# Patient Record
Sex: Female | Born: 1955
Health system: Southern US, Community
[De-identification: ages and names within clinical notes are randomized; demographics above are authoritative.]

## PROBLEM LIST (undated history)

## (undated) DIAGNOSIS — M5136 Other intervertebral disc degeneration, lumbar region: Secondary | ICD-10-CM

## (undated) DIAGNOSIS — T8859XA Other complications of anesthesia, initial encounter: Secondary | ICD-10-CM

## (undated) DIAGNOSIS — R7303 Prediabetes: Secondary | ICD-10-CM

## (undated) DIAGNOSIS — Z8719 Personal history of other diseases of the digestive system: Secondary | ICD-10-CM

## (undated) DIAGNOSIS — L408 Other psoriasis: Secondary | ICD-10-CM

## (undated) DIAGNOSIS — T4145XA Adverse effect of unspecified anesthetic, initial encounter: Secondary | ICD-10-CM

## (undated) DIAGNOSIS — T7840XA Allergy, unspecified, initial encounter: Secondary | ICD-10-CM

## (undated) DIAGNOSIS — K589 Irritable bowel syndrome without diarrhea: Secondary | ICD-10-CM

## (undated) DIAGNOSIS — F419 Anxiety disorder, unspecified: Secondary | ICD-10-CM

## (undated) DIAGNOSIS — M1990 Unspecified osteoarthritis, unspecified site: Secondary | ICD-10-CM

## (undated) DIAGNOSIS — K2270 Barrett's esophagus without dysplasia: Secondary | ICD-10-CM

## (undated) DIAGNOSIS — K219 Gastro-esophageal reflux disease without esophagitis: Secondary | ICD-10-CM

## (undated) DIAGNOSIS — F329 Major depressive disorder, single episode, unspecified: Secondary | ICD-10-CM

## (undated) DIAGNOSIS — G4720 Circadian rhythm sleep disorder, unspecified type: Secondary | ICD-10-CM

## (undated) DIAGNOSIS — M25512 Pain in left shoulder: Secondary | ICD-10-CM

## (undated) DIAGNOSIS — E119 Type 2 diabetes mellitus without complications: Secondary | ICD-10-CM

## (undated) DIAGNOSIS — C50919 Malignant neoplasm of unspecified site of unspecified female breast: Secondary | ICD-10-CM

## (undated) DIAGNOSIS — M51369 Other intervertebral disc degeneration, lumbar region without mention of lumbar back pain or lower extremity pain: Secondary | ICD-10-CM

## (undated) DIAGNOSIS — F32A Depression, unspecified: Secondary | ICD-10-CM

## (undated) DIAGNOSIS — E669 Obesity, unspecified: Secondary | ICD-10-CM

## (undated) DIAGNOSIS — M4800 Spinal stenosis, site unspecified: Secondary | ICD-10-CM

## (undated) DIAGNOSIS — J45909 Unspecified asthma, uncomplicated: Secondary | ICD-10-CM

## (undated) DIAGNOSIS — Z9889 Other specified postprocedural states: Secondary | ICD-10-CM

## (undated) HISTORY — DX: Other specified postprocedural states: Z98.890

## (undated) HISTORY — PX: DIAGNOSTIC LAPAROSCOPY: SUR761

## (undated) HISTORY — DX: Other psoriasis: L40.8

## (undated) HISTORY — PX: PARTIAL HYSTERECTOMY: SHX80

## (undated) HISTORY — PX: ABDOMINAL HYSTERECTOMY: SHX81

## (undated) HISTORY — DX: Prediabetes: R73.03

## (undated) HISTORY — PX: BREAST BIOPSY: SHX20

## (undated) HISTORY — PX: UPPER GASTROINTESTINAL ENDOSCOPY: SHX188

## (undated) HISTORY — DX: Depression, unspecified: F32.A

## (undated) HISTORY — DX: Obesity, unspecified: E66.9

## (undated) HISTORY — PX: BUNIONECTOMY: SHX129

## (undated) HISTORY — PX: OTHER SURGICAL HISTORY: SHX169

## (undated) HISTORY — PX: PILONIDAL CYST EXCISION: SHX744

## (undated) HISTORY — DX: Malignant neoplasm of unspecified site of unspecified female breast: C50.919

## (undated) HISTORY — DX: Personal history of other diseases of the digestive system: Z87.19

## (undated) HISTORY — DX: Allergy, unspecified, initial encounter: T78.40XA

## (undated) HISTORY — PX: COLONOSCOPY: SHX174

## (undated) HISTORY — DX: Major depressive disorder, single episode, unspecified: F32.9

## (undated) HISTORY — PX: WISDOM TOOTH EXTRACTION: SHX21

---

## 2003-07-30 HISTORY — PX: BREAST LUMPECTOMY: SHX2

## 2004-05-08 ENCOUNTER — Ambulatory Visit (HOSPITAL_BASED_OUTPATIENT_CLINIC_OR_DEPARTMENT_OTHER): Admission: RE | Admit: 2004-05-08 | Discharge: 2004-05-08 | Payer: Self-pay | Admitting: General Surgery

## 2004-05-08 ENCOUNTER — Encounter (INDEPENDENT_AMBULATORY_CARE_PROVIDER_SITE_OTHER): Payer: Self-pay | Admitting: *Deleted

## 2004-05-08 ENCOUNTER — Encounter: Admission: RE | Admit: 2004-05-08 | Discharge: 2004-05-08 | Payer: Self-pay | Admitting: General Surgery

## 2004-05-08 ENCOUNTER — Ambulatory Visit (HOSPITAL_COMMUNITY): Admission: RE | Admit: 2004-05-08 | Discharge: 2004-05-08 | Payer: Self-pay | Admitting: General Surgery

## 2004-05-30 ENCOUNTER — Ambulatory Visit: Admission: RE | Admit: 2004-05-30 | Discharge: 2004-07-26 | Payer: Self-pay | Admitting: Radiation Oncology

## 2004-06-11 ENCOUNTER — Ambulatory Visit: Payer: Self-pay | Admitting: Oncology

## 2004-08-22 ENCOUNTER — Ambulatory Visit: Admission: RE | Admit: 2004-08-22 | Discharge: 2004-09-07 | Payer: Self-pay | Admitting: Radiation Oncology

## 2004-09-17 ENCOUNTER — Ambulatory Visit: Payer: Self-pay | Admitting: Oncology

## 2004-10-04 ENCOUNTER — Encounter: Admission: RE | Admit: 2004-10-04 | Discharge: 2004-10-04 | Payer: Self-pay | Admitting: Oncology

## 2005-01-16 ENCOUNTER — Encounter: Admission: RE | Admit: 2005-01-16 | Discharge: 2005-01-16 | Payer: Self-pay | Admitting: Oncology

## 2005-01-28 ENCOUNTER — Encounter: Admission: RE | Admit: 2005-01-28 | Discharge: 2005-01-28 | Payer: Self-pay | Admitting: Oncology

## 2005-01-30 ENCOUNTER — Ambulatory Visit: Payer: Self-pay | Admitting: Oncology

## 2005-07-26 ENCOUNTER — Ambulatory Visit: Payer: Self-pay | Admitting: Oncology

## 2006-01-20 ENCOUNTER — Ambulatory Visit: Payer: Self-pay | Admitting: Oncology

## 2006-01-20 ENCOUNTER — Encounter: Admission: RE | Admit: 2006-01-20 | Discharge: 2006-01-20 | Payer: Self-pay | Admitting: Family Medicine

## 2006-01-22 LAB — COMPREHENSIVE METABOLIC PANEL
Albumin: 4.2 g/dL (ref 3.5–5.2)
Alkaline Phosphatase: 123 U/L — ABNORMAL HIGH (ref 39–117)
BUN: 13 mg/dL (ref 6–23)
Calcium: 9.3 mg/dL (ref 8.4–10.5)
Glucose, Bld: 110 mg/dL — ABNORMAL HIGH (ref 70–99)
Potassium: 4.2 mEq/L (ref 3.5–5.3)

## 2006-01-22 LAB — CBC WITH DIFFERENTIAL/PLATELET
Basophils Absolute: 0 10*3/uL (ref 0.0–0.1)
Eosinophils Absolute: 0.2 10*3/uL (ref 0.0–0.5)
HGB: 15.4 g/dL (ref 11.6–15.9)
NEUT#: 3.8 10*3/uL (ref 1.5–6.5)
RDW: 12.7 % (ref 11.3–14.5)
lymph#: 2 10*3/uL (ref 0.9–3.3)

## 2006-07-14 ENCOUNTER — Ambulatory Visit: Payer: Self-pay | Admitting: Oncology

## 2006-07-16 LAB — CBC WITH DIFFERENTIAL/PLATELET
Basophils Absolute: 0 10*3/uL (ref 0.0–0.1)
EOS%: 2.3 % (ref 0.0–7.0)
HCT: 46.2 % (ref 34.8–46.6)
HGB: 15.6 g/dL (ref 11.6–15.9)
MCH: 33.2 pg (ref 26.0–34.0)
MONO#: 0.4 10*3/uL (ref 0.1–0.9)
NEUT%: 62.8 % (ref 39.6–76.8)
lymph#: 2.1 10*3/uL (ref 0.9–3.3)

## 2006-07-16 LAB — LACTATE DEHYDROGENASE: LDH: 192 U/L (ref 94–250)

## 2006-07-16 LAB — COMPREHENSIVE METABOLIC PANEL
BUN: 18 mg/dL (ref 6–23)
CO2: 28 mEq/L (ref 19–32)
Calcium: 9.4 mg/dL (ref 8.4–10.5)
Chloride: 101 mEq/L (ref 96–112)
Creatinine, Ser: 0.85 mg/dL (ref 0.40–1.20)

## 2007-01-13 ENCOUNTER — Ambulatory Visit: Payer: Self-pay | Admitting: Oncology

## 2007-01-15 LAB — CBC WITH DIFFERENTIAL/PLATELET
Eosinophils Absolute: 0.2 10*3/uL (ref 0.0–0.5)
MONO#: 0.3 10*3/uL (ref 0.1–0.9)
NEUT#: 3.9 10*3/uL (ref 1.5–6.5)
Platelets: 286 10*3/uL (ref 145–400)
RBC: 4.68 10*6/uL (ref 3.70–5.32)
RDW: 13.2 % (ref 11.3–14.5)
WBC: 6.9 10*3/uL (ref 3.9–10.0)

## 2007-01-15 LAB — COMPREHENSIVE METABOLIC PANEL
Albumin: 4.3 g/dL (ref 3.5–5.2)
CO2: 27 mEq/L (ref 19–32)
Chloride: 102 mEq/L (ref 96–112)
Glucose, Bld: 103 mg/dL — ABNORMAL HIGH (ref 70–99)
Potassium: 4.5 mEq/L (ref 3.5–5.3)
Sodium: 143 mEq/L (ref 135–145)
Total Protein: 6.9 g/dL (ref 6.0–8.3)

## 2007-01-15 LAB — LACTATE DEHYDROGENASE: LDH: 184 U/L (ref 94–250)

## 2007-01-27 ENCOUNTER — Encounter: Admission: RE | Admit: 2007-01-27 | Discharge: 2007-01-27 | Payer: Self-pay | Admitting: Oncology

## 2007-07-17 ENCOUNTER — Ambulatory Visit: Payer: Self-pay | Admitting: Oncology

## 2007-07-21 LAB — CBC WITH DIFFERENTIAL/PLATELET
BASO%: 0.6 % (ref 0.0–2.0)
Eosinophils Absolute: 0.1 10*3/uL (ref 0.0–0.5)
MCHC: 34.6 g/dL (ref 32.0–36.0)
MONO#: 0.4 10*3/uL (ref 0.1–0.9)
NEUT#: 4.8 10*3/uL (ref 1.5–6.5)
RBC: 4.58 10*6/uL (ref 3.70–5.32)
RDW: 13.5 % (ref 11.3–14.5)
WBC: 7.5 10*3/uL (ref 3.9–10.0)
lymph#: 2.2 10*3/uL (ref 0.9–3.3)

## 2007-07-21 LAB — COMPREHENSIVE METABOLIC PANEL
ALT: 28 U/L (ref 0–35)
Albumin: 4.2 g/dL (ref 3.5–5.2)
CO2: 26 mEq/L (ref 19–32)
Chloride: 101 mEq/L (ref 96–112)
Potassium: 4.2 mEq/L (ref 3.5–5.3)
Sodium: 140 mEq/L (ref 135–145)
Total Bilirubin: 0.7 mg/dL (ref 0.3–1.2)
Total Protein: 7.2 g/dL (ref 6.0–8.3)

## 2008-01-06 ENCOUNTER — Ambulatory Visit: Payer: Self-pay | Admitting: Oncology

## 2008-01-11 LAB — CBC WITH DIFFERENTIAL/PLATELET
Basophils Absolute: 0 10*3/uL (ref 0.0–0.1)
EOS%: 1.6 % (ref 0.0–7.0)
HCT: 44.1 % (ref 34.8–46.6)
HGB: 15.4 g/dL (ref 11.6–15.9)
LYMPH%: 26.5 % (ref 14.0–48.0)
MCH: 33.8 pg (ref 26.0–34.0)
MCV: 96.4 fL (ref 81.0–101.0)
MONO%: 5.3 % (ref 0.0–13.0)
NEUT%: 66.2 % (ref 39.6–76.8)
Platelets: 292 10*3/uL (ref 145–400)
lymph#: 2.3 10*3/uL (ref 0.9–3.3)

## 2008-01-11 LAB — COMPREHENSIVE METABOLIC PANEL
AST: 19 U/L (ref 0–37)
BUN: 19 mg/dL (ref 6–23)
Calcium: 9.2 mg/dL (ref 8.4–10.5)
Chloride: 105 mEq/L (ref 96–112)
Creatinine, Ser: 0.88 mg/dL (ref 0.40–1.20)
Total Bilirubin: 0.5 mg/dL (ref 0.3–1.2)

## 2008-01-28 ENCOUNTER — Encounter: Admission: RE | Admit: 2008-01-28 | Discharge: 2008-01-28 | Payer: Self-pay | Admitting: Oncology

## 2008-07-18 ENCOUNTER — Ambulatory Visit: Payer: Self-pay | Admitting: Oncology

## 2008-07-20 LAB — CBC WITH DIFFERENTIAL/PLATELET
Basophils Absolute: 0.1 10*3/uL (ref 0.0–0.1)
EOS%: 3 % (ref 0.0–7.0)
Eosinophils Absolute: 0.2 10*3/uL (ref 0.0–0.5)
HCT: 44.6 % (ref 34.8–46.6)
HGB: 15.2 g/dL (ref 11.6–15.9)
LYMPH%: 26.3 % (ref 14.0–48.0)
MCH: 33.7 pg (ref 26.0–34.0)
MCV: 98.8 fL (ref 81.0–101.0)
MONO%: 6.3 % (ref 0.0–13.0)
NEUT%: 63.2 % (ref 39.6–76.8)
Platelets: 285 10*3/uL (ref 145–400)
RDW: 13.6 % (ref 11.3–14.5)

## 2008-07-21 LAB — COMPREHENSIVE METABOLIC PANEL
AST: 17 U/L (ref 0–37)
Alkaline Phosphatase: 118 U/L — ABNORMAL HIGH (ref 39–117)
BUN: 15 mg/dL (ref 6–23)
Creatinine, Ser: 0.87 mg/dL (ref 0.40–1.20)
Glucose, Bld: 148 mg/dL — ABNORMAL HIGH (ref 70–99)
Total Bilirubin: 0.4 mg/dL (ref 0.3–1.2)

## 2008-07-21 LAB — VITAMIN D 25 HYDROXY (VIT D DEFICIENCY, FRACTURES): Vit D, 25-Hydroxy: 54 ng/mL (ref 30–89)

## 2009-01-02 ENCOUNTER — Ambulatory Visit: Payer: Self-pay | Admitting: Oncology

## 2009-01-04 LAB — COMPREHENSIVE METABOLIC PANEL
ALT: 29 U/L (ref 0–35)
AST: 25 U/L (ref 0–37)
Albumin: 3.7 g/dL (ref 3.5–5.2)
Alkaline Phosphatase: 121 U/L — ABNORMAL HIGH (ref 39–117)
BUN: 11 mg/dL (ref 6–23)
CO2: 26 mEq/L (ref 19–32)
Calcium: 9.3 mg/dL (ref 8.4–10.5)
Chloride: 105 mEq/L (ref 96–112)
Creatinine, Ser: 0.81 mg/dL (ref 0.40–1.20)
Glucose, Bld: 112 mg/dL — ABNORMAL HIGH (ref 70–99)
Potassium: 4.6 mEq/L (ref 3.5–5.3)
Sodium: 138 mEq/L (ref 135–145)
Total Bilirubin: 0.8 mg/dL (ref 0.3–1.2)
Total Protein: 7.1 g/dL (ref 6.0–8.3)

## 2009-01-04 LAB — CBC WITH DIFFERENTIAL/PLATELET
BASO%: 0.3 % (ref 0.0–2.0)
Basophils Absolute: 0 10*3/uL (ref 0.0–0.1)
EOS%: 2.7 % (ref 0.0–7.0)
Eosinophils Absolute: 0.2 10*3/uL (ref 0.0–0.5)
HCT: 44.7 % (ref 34.8–46.6)
HGB: 15.7 g/dL (ref 11.6–15.9)
LYMPH%: 30.4 % (ref 14.0–49.7)
MCH: 34.5 pg — ABNORMAL HIGH (ref 25.1–34.0)
MCHC: 35.2 g/dL (ref 31.5–36.0)
MCV: 98.1 fL (ref 79.5–101.0)
MONO#: 0.4 10*3/uL (ref 0.1–0.9)
MONO%: 5.5 % (ref 0.0–14.0)
NEUT#: 4.5 10*3/uL (ref 1.5–6.5)
NEUT%: 61.1 % (ref 38.4–76.8)
Platelets: 258 10*3/uL (ref 145–400)
RBC: 4.55 10*6/uL (ref 3.70–5.45)
RDW: 13.9 % (ref 11.2–14.5)
WBC: 7.3 10*3/uL (ref 3.9–10.3)
lymph#: 2.2 10*3/uL (ref 0.9–3.3)

## 2009-01-04 LAB — LACTATE DEHYDROGENASE: LDH: 159 U/L (ref 94–250)

## 2009-02-02 ENCOUNTER — Encounter: Admission: RE | Admit: 2009-02-02 | Discharge: 2009-02-02 | Payer: Self-pay | Admitting: Oncology

## 2009-07-11 ENCOUNTER — Ambulatory Visit: Payer: Self-pay | Admitting: Oncology

## 2009-07-13 LAB — COMPREHENSIVE METABOLIC PANEL
ALT: 34 U/L (ref 0–35)
AST: 27 U/L (ref 0–37)
Albumin: 3.9 g/dL (ref 3.5–5.2)
Alkaline Phosphatase: 117 U/L (ref 39–117)
BUN: 11 mg/dL (ref 6–23)
CO2: 26 mEq/L (ref 19–32)
Calcium: 9.5 mg/dL (ref 8.4–10.5)
Chloride: 104 mEq/L (ref 96–112)
Creatinine, Ser: 0.87 mg/dL (ref 0.40–1.20)
Glucose, Bld: 63 mg/dL — ABNORMAL LOW (ref 70–99)
Potassium: 4.2 mEq/L (ref 3.5–5.3)
Sodium: 137 mEq/L (ref 135–145)
Total Bilirubin: 0.8 mg/dL (ref 0.3–1.2)
Total Protein: 7.6 g/dL (ref 6.0–8.3)

## 2009-07-13 LAB — CBC WITH DIFFERENTIAL/PLATELET
BASO%: 0.7 % (ref 0.0–2.0)
Basophils Absolute: 0.1 10*3/uL (ref 0.0–0.1)
EOS%: 2.4 % (ref 0.0–7.0)
Eosinophils Absolute: 0.2 10*3/uL (ref 0.0–0.5)
HCT: 48 % — ABNORMAL HIGH (ref 34.8–46.6)
HGB: 16.3 g/dL — ABNORMAL HIGH (ref 11.6–15.9)
LYMPH%: 36.6 % (ref 14.0–49.7)
MCH: 34.2 pg — ABNORMAL HIGH (ref 25.1–34.0)
MCHC: 33.9 g/dL (ref 31.5–36.0)
MCV: 100.8 fL (ref 79.5–101.0)
MONO#: 0.5 10*3/uL (ref 0.1–0.9)
MONO%: 7.1 % (ref 0.0–14.0)
NEUT#: 3.8 10*3/uL (ref 1.5–6.5)
NEUT%: 53.2 % (ref 38.4–76.8)
Platelets: 259 10*3/uL (ref 145–400)
RBC: 4.76 10*6/uL (ref 3.70–5.45)
RDW: 13.7 % (ref 11.2–14.5)
WBC: 7.2 10*3/uL (ref 3.9–10.3)
lymph#: 2.6 10*3/uL (ref 0.9–3.3)

## 2009-07-13 LAB — LACTATE DEHYDROGENASE: LDH: 170 U/L (ref 94–250)

## 2009-07-14 LAB — VITAMIN D 25 HYDROXY (VIT D DEFICIENCY, FRACTURES): Vit D, 25-Hydroxy: 34 ng/mL (ref 30–89)

## 2009-07-23 ENCOUNTER — Emergency Department (HOSPITAL_BASED_OUTPATIENT_CLINIC_OR_DEPARTMENT_OTHER): Admission: EM | Admit: 2009-07-23 | Discharge: 2009-07-23 | Payer: Self-pay | Admitting: Emergency Medicine

## 2010-02-13 ENCOUNTER — Encounter: Admission: RE | Admit: 2010-02-13 | Discharge: 2010-02-13 | Payer: Self-pay | Admitting: Oncology

## 2010-07-10 ENCOUNTER — Ambulatory Visit: Payer: Self-pay | Admitting: Oncology

## 2010-07-12 LAB — CBC WITH DIFFERENTIAL/PLATELET
BASO%: 0.5 % (ref 0.0–2.0)
Basophils Absolute: 0 10*3/uL (ref 0.0–0.1)
EOS%: 2.8 % (ref 0.0–7.0)
Eosinophils Absolute: 0.2 10*3/uL (ref 0.0–0.5)
HCT: 44.7 % (ref 34.8–46.6)
HGB: 15.4 g/dL (ref 11.6–15.9)
LYMPH%: 28.2 % (ref 14.0–49.7)
MCH: 34.8 pg — ABNORMAL HIGH (ref 25.1–34.0)
MCHC: 34.6 g/dL (ref 31.5–36.0)
MCV: 100.5 fL (ref 79.5–101.0)
MONO#: 0.4 10*3/uL (ref 0.1–0.9)
MONO%: 5.5 % (ref 0.0–14.0)
NEUT#: 5 10*3/uL (ref 1.5–6.5)
NEUT%: 63 % (ref 38.4–76.8)
Platelets: 289 10*3/uL (ref 145–400)
RBC: 4.44 10*6/uL (ref 3.70–5.45)
RDW: 13.4 % (ref 11.2–14.5)
WBC: 7.9 10*3/uL (ref 3.9–10.3)
lymph#: 2.2 10*3/uL (ref 0.9–3.3)

## 2010-07-13 LAB — COMPREHENSIVE METABOLIC PANEL
ALT: 24 U/L (ref 0–35)
AST: 23 U/L (ref 0–37)
Albumin: 4 g/dL (ref 3.5–5.2)
Alkaline Phosphatase: 109 U/L (ref 39–117)
BUN: 13 mg/dL (ref 6–23)
CO2: 23 mEq/L (ref 19–32)
Calcium: 9 mg/dL (ref 8.4–10.5)
Chloride: 105 mEq/L (ref 96–112)
Creatinine, Ser: 0.98 mg/dL (ref 0.40–1.20)
Glucose, Bld: 94 mg/dL (ref 70–99)
Potassium: 4.5 mEq/L (ref 3.5–5.3)
Sodium: 139 mEq/L (ref 135–145)
Total Bilirubin: 0.3 mg/dL (ref 0.3–1.2)
Total Protein: 6.4 g/dL (ref 6.0–8.3)

## 2010-07-13 LAB — LACTATE DEHYDROGENASE: LDH: 168 U/L (ref 94–250)

## 2010-07-13 LAB — VITAMIN D 25 HYDROXY (VIT D DEFICIENCY, FRACTURES): Vit D, 25-Hydroxy: 54 ng/mL (ref 30–89)

## 2010-08-19 ENCOUNTER — Encounter: Payer: Self-pay | Admitting: Oncology

## 2010-08-20 ENCOUNTER — Encounter: Payer: Self-pay | Admitting: Oncology

## 2010-09-13 ENCOUNTER — Other Ambulatory Visit: Payer: Self-pay | Admitting: Oncology

## 2010-09-13 DIAGNOSIS — Z9889 Other specified postprocedural states: Secondary | ICD-10-CM

## 2010-12-14 NOTE — Op Note (Signed)
Lindsey Keller, Lindsey Keller           ACCOUNT NO.:  [id number]   MEDICAL RECORD NO.:  [id number]          PATIENT TYPE:  AMB   LOCATION:  DSC                          FACILITY:  [facility]   PHYSICIAN:  Rose Phi. Maple Hudson, M.D.   DATE OF BIRTH:  April 21, 1956   DATE OF PROCEDURE:  05/08/2004  DATE OF DISCHARGE:                                 OPERATIVE REPORT   PREOPERATIVE DIAGNOSIS:  Ductal carcinoma in situ of the left breast.   POSTOPERATIVE DIAGNOSIS:  Ductal carcinoma in situ of the left breast.   OPERATION:  Left partial mastectomy with needle localization and specimen  mammogram.   SURGEON:  Rose Phi. Maple Hudson, M.D.   ANESTHESIA:  General.   OPERATIVE PROCEDURE:  This patient had a routine mammogram done that showed  an area of microcalcifications in the 12 o'clock position of her left  breast.  Biopsy had shown this to be DCIS that was ER/PR positive.  She was  then scheduled for partial mastectomy.   Prior to coming to the operating room, a localizing wire had been placed.   After general anesthesia was induced, she was placed in the supine position  with the arms extended on the arm board and the left breast prepped and  draped in the usual fashion.  A curved incision centered at the 12 o'clock  position using guidance from the previously-placed wire was outlined and the  incision then made and the wire exposed and then a wide excision of the wire  and surrounding tissue carried out.  Hemostasis obtained with the cautery.  Specimen oriented for the pathologist.  Specimen mammogram confirmed the  removal of the lesion.   I then infiltrated the tissue with 0.25% Marcaine.  The incision was closed  in two layers of 3-0 Vicryl and subcuticular 4-0 Monocryl and Steri-Strips.  Dressing applied.  The patient was brought to the recovery room in  satisfactory condition, having tolerated the procedure well. __________.      Pete   PRY/MEDQ  D:  05/08/2004  T:  05/08/2004  Job:  [id number]

## 2011-02-19 ENCOUNTER — Ambulatory Visit
Admission: RE | Admit: 2011-02-19 | Discharge: 2011-02-19 | Disposition: A | Discharge status: Home / Self Care | Payer: 59 | Source: Ambulatory Visit | Attending: Oncology | Admitting: Oncology

## 2011-02-19 DIAGNOSIS — Z9889 Other specified postprocedural states: Secondary | ICD-10-CM

## 2011-07-11 ENCOUNTER — Other Ambulatory Visit (HOSPITAL_BASED_OUTPATIENT_CLINIC_OR_DEPARTMENT_OTHER): Payer: 59 | Admitting: Lab

## 2011-07-11 ENCOUNTER — Other Ambulatory Visit: Payer: Self-pay | Admitting: Oncology

## 2011-07-11 DIAGNOSIS — D0590 Unspecified type of carcinoma in situ of unspecified breast: Secondary | ICD-10-CM

## 2011-07-11 LAB — COMPREHENSIVE METABOLIC PANEL
ALT: 21 U/L (ref 0–35)
Alkaline Phosphatase: 101 U/L (ref 39–117)
CO2: 26 mEq/L (ref 19–32)
Sodium: 139 mEq/L (ref 135–145)
Total Bilirubin: 0.5 mg/dL (ref 0.3–1.2)
Total Protein: 7.1 g/dL (ref 6.0–8.3)

## 2011-07-11 LAB — CBC WITH DIFFERENTIAL/PLATELET
BASO%: 0.7 % (ref 0.0–2.0)
LYMPH%: 28.4 % (ref 14.0–49.7)
MCHC: 34 g/dL (ref 31.5–36.0)
MONO#: 0.3 10*3/uL (ref 0.1–0.9)
MONO%: 4.6 % (ref 0.0–14.0)
Platelets: 281 10*3/uL (ref 145–400)
RBC: 4.77 10*6/uL (ref 3.70–5.45)
RDW: 13.6 % (ref 11.2–14.5)
WBC: 7.4 10*3/uL (ref 3.9–10.3)

## 2011-07-19 ENCOUNTER — Telehealth: Payer: Self-pay | Admitting: Oncology

## 2011-07-19 ENCOUNTER — Ambulatory Visit (HOSPITAL_BASED_OUTPATIENT_CLINIC_OR_DEPARTMENT_OTHER): Payer: 59 | Admitting: Oncology

## 2011-07-19 ENCOUNTER — Other Ambulatory Visit: Payer: Self-pay | Admitting: Oncology

## 2011-07-19 ENCOUNTER — Encounter: Payer: Self-pay | Admitting: Oncology

## 2011-07-19 VITALS — BP 111/75 | HR 101 | Temp 98.1°F | Ht 62.5 in | Wt 243.2 lb

## 2011-07-19 DIAGNOSIS — E559 Vitamin D deficiency, unspecified: Secondary | ICD-10-CM

## 2011-07-19 DIAGNOSIS — C50919 Malignant neoplasm of unspecified site of unspecified female breast: Secondary | ICD-10-CM

## 2011-07-19 DIAGNOSIS — E119 Type 2 diabetes mellitus without complications: Secondary | ICD-10-CM

## 2011-07-19 DIAGNOSIS — Z853 Personal history of malignant neoplasm of breast: Secondary | ICD-10-CM

## 2011-07-19 NOTE — Telephone Encounter (Signed)
called pt and informed her of appts in dec2013 with mammogram and bone density for 02/25/2012 @ 9am @ [facility] in [place]

## 2011-07-19 NOTE — Progress Notes (Signed)
07/19/11 -  11:30 am - Spoke with patient when she came into cancer center to see Dr. Donnie Coffin. She is on the NSABP B-35 study.  This is a follow-up visit.  She states that she had not been in the hospital in the last year. She has not had any fractures or broken bones.  The is not on any medications except calcium and vitamin D.  She has not had a bone density test in the last year.  She is not taking any vaginal hormonal therapy. She is being followed yearly for the study.

## 2011-07-19 NOTE — Progress Notes (Signed)
Hematology and Oncology Follow Up Visit  Lindsey Keller [id number] 1956/05/25 55 y.o. 07/19/2011 12:29 PM PCP [facility] [place]  Principle Diagnosis: DCIS s/p excision 10/05; s/p xrt 07/24/04, er/pr+ on B35 study, completed 2010, On annual f/u.  Interim History:  There have been no intercurrent illness, hospitalizations or medication changes.  Medications: I have reviewed the patient's current medications.  Allergies: No Known Allergies  Past Medical History, Surgical history, Social history, and Family History were reviewed and updated.  Review of Systems: Constitutional:  Negative for fever, chills, night sweats, anorexia, weight loss, pain. Cardiovascular: no chest pain or dyspnea on exertion Respiratory: no cough, shortness of breath, or wheezing Neurological: no TIA or stroke symptoms Dermatological: negative ENT: negative Skin Gastrointestinal: no abdominal pain, change in bowel habits, or black or bloody stools Genito-Urinary: no dysuria, trouble voiding, or hematuria Hematological and Lymphatic: negative Breast: negative for breast lumps Musculoskeletal: negative Remaining ROS negative.  Physical Exam: Blood pressure 111/75, pulse 101, temperature 98.1 F (36.7 C), height 5' 2.5" (1.588 m), weight 243 lb 3.2 oz (110.315 kg). ECOG: 0 General appearance: alert, cooperative and appears stated age Head: Normocephalic, without obvious abnormality, atraumatic Neck: no adenopathy, no carotid bruit, no JVD, supple, symmetrical, trachea midline and thyroid not enlarged, symmetric, no tenderness/mass/nodules Lymph nodes: Cervical, supraclavicular, and axillary nodes normal. Cardiac : regular rate and rhythm, no murmurs or gallops Pulmonary:clear to auscultation bilaterally and normal percussion bilaterally Breasts: inspection negative, no nipple discharge or bleeding, no masses or nodularity palpable Abdomen:soft, non-tender; bowel sounds normal; no  masses,  no organomegaly Extremities negative Neuro: alert, oriented, normal speech, no focal findings or movement disorder noted  Lab Results: Lab Results  Component Value Date   WBC 7.4 07/11/2011   HGB 16.2* 07/11/2011   HCT 47.6* 07/11/2011   MCV 99.7 07/11/2011   PLT 281 07/11/2011     Chemistry      Component Value Date/Time   NA 139 07/11/2011 1121   NA 139 07/11/2011 1121   K 4.6 07/11/2011 1121   K 4.6 07/11/2011 1121   CL 102 07/11/2011 1121   CL 102 07/11/2011 1121   CO2 26 07/11/2011 1121   CO2 26 07/11/2011 1121   BUN 13 07/11/2011 1121   BUN 13 07/11/2011 1121   CREATININE 0.72 07/11/2011 1121   CREATININE 0.72 07/11/2011 1121      Component Value Date/Time   CALCIUM 9.3 07/11/2011 1121   CALCIUM 9.3 07/11/2011 1121   ALKPHOS 101 07/11/2011 1121   ALKPHOS 101 07/11/2011 1121   AST 20 07/11/2011 1121   AST 20 07/11/2011 1121   ALT 21 07/11/2011 1121   ALT 21 07/11/2011 1121   BILITOT 0.5 07/11/2011 1121   BILITOT 0.5 07/11/2011 1121      .pathology. Radiological Studies: chest X-ray n/a Mammogram Due 07/13 Bone density Due 07/13  Impression and Plan: Lindsey Keller is doing well, NED. Her DMII has improved and she is no longer taking ,medication. F/u 1 yr.  More than 50% of the visit was spent in patient-related counselling   Pierce Crane, MD 12/21/201212:29 PM

## 2011-07-19 NOTE — Progress Notes (Deleted)
07/19/11 -  11:30 am - Spoke with patient when she came into cancer center to see Dr. Rubin. She is on the NSABP B-35 study.  This is a follow-up visit.  She states that she had not been in the hospital in the last year. She has not had any fractures or broken bones.  The is not on any medications except calcium and vitamin D.  She has not had a bone density test in the last year.  She is not taking any vaginal hormonal therapy. She is being followed yearly for the study. 

## 2011-11-18 DIAGNOSIS — K219 Gastro-esophageal reflux disease without esophagitis: Secondary | ICD-10-CM | POA: Insufficient documentation

## 2012-02-04 ENCOUNTER — Ambulatory Visit (INDEPENDENT_AMBULATORY_CARE_PROVIDER_SITE_OTHER): Payer: 59

## 2012-02-04 DIAGNOSIS — E559 Vitamin D deficiency, unspecified: Secondary | ICD-10-CM

## 2012-02-04 DIAGNOSIS — E119 Type 2 diabetes mellitus without complications: Secondary | ICD-10-CM

## 2012-02-04 DIAGNOSIS — Z13820 Encounter for screening for osteoporosis: Secondary | ICD-10-CM

## 2012-02-04 DIAGNOSIS — C50919 Malignant neoplasm of unspecified site of unspecified female breast: Secondary | ICD-10-CM

## 2012-02-25 ENCOUNTER — Ambulatory Visit (INDEPENDENT_AMBULATORY_CARE_PROVIDER_SITE_OTHER)
Admission: RE | Admit: 2012-02-25 | Discharge: 2012-02-25 | Payer: 59 | Source: Ambulatory Visit | Attending: Oncology | Admitting: Oncology

## 2012-02-25 ENCOUNTER — Other Ambulatory Visit: Payer: 59

## 2012-02-25 ENCOUNTER — Other Ambulatory Visit: Payer: Self-pay | Admitting: Oncology

## 2012-02-25 DIAGNOSIS — C50919 Malignant neoplasm of unspecified site of unspecified female breast: Secondary | ICD-10-CM

## 2012-02-25 DIAGNOSIS — Z853 Personal history of malignant neoplasm of breast: Secondary | ICD-10-CM

## 2012-02-25 DIAGNOSIS — Z1231 Encounter for screening mammogram for malignant neoplasm of breast: Secondary | ICD-10-CM

## 2012-02-25 DIAGNOSIS — E119 Type 2 diabetes mellitus without complications: Secondary | ICD-10-CM

## 2012-02-25 DIAGNOSIS — E559 Vitamin D deficiency, unspecified: Secondary | ICD-10-CM

## 2012-02-28 ENCOUNTER — Other Ambulatory Visit: Payer: 59

## 2012-02-28 ENCOUNTER — Ambulatory Visit: Payer: 59

## 2012-07-17 ENCOUNTER — Other Ambulatory Visit: Payer: 59 | Admitting: Lab

## 2012-07-18 ENCOUNTER — Telehealth: Payer: Self-pay | Admitting: *Deleted

## 2012-07-18 NOTE — Telephone Encounter (Signed)
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  She was already made aware about Dr. Donnie Coffin and had no questions.  I confirmed 07/20/12 appt w/ pt.

## 2012-07-18 NOTE — Telephone Encounter (Signed)
spoke with patient pateint stated she would rather move her appointment to 07-2012 gave lisa morrison the patient information so she can call and give her the md appointment

## 2012-07-20 ENCOUNTER — Other Ambulatory Visit (HOSPITAL_BASED_OUTPATIENT_CLINIC_OR_DEPARTMENT_OTHER): Payer: BC Managed Care – PPO | Admitting: Lab

## 2012-07-20 ENCOUNTER — Other Ambulatory Visit: Payer: Self-pay | Admitting: Oncology

## 2012-07-20 ENCOUNTER — Encounter: Payer: Self-pay | Admitting: Oncology

## 2012-07-20 ENCOUNTER — Other Ambulatory Visit: Payer: Self-pay | Admitting: *Deleted

## 2012-07-20 ENCOUNTER — Ambulatory Visit (HOSPITAL_BASED_OUTPATIENT_CLINIC_OR_DEPARTMENT_OTHER): Payer: BC Managed Care – PPO | Admitting: Oncology

## 2012-07-20 ENCOUNTER — Telehealth: Payer: Self-pay | Admitting: *Deleted

## 2012-07-20 VITALS — BP 113/77 | HR 108 | Temp 97.8°F | Resp 20 | Ht 62.5 in | Wt 245.5 lb

## 2012-07-20 DIAGNOSIS — E119 Type 2 diabetes mellitus without complications: Secondary | ICD-10-CM

## 2012-07-20 DIAGNOSIS — D0512 Intraductal carcinoma in situ of left breast: Secondary | ICD-10-CM | POA: Insufficient documentation

## 2012-07-20 DIAGNOSIS — D0590 Unspecified type of carcinoma in situ of unspecified breast: Secondary | ICD-10-CM

## 2012-07-20 DIAGNOSIS — E559 Vitamin D deficiency, unspecified: Secondary | ICD-10-CM

## 2012-07-20 DIAGNOSIS — Z170 Estrogen receptor positive status [ER+]: Secondary | ICD-10-CM

## 2012-07-20 DIAGNOSIS — D0510 Intraductal carcinoma in situ of unspecified breast: Secondary | ICD-10-CM

## 2012-07-20 DIAGNOSIS — C50919 Malignant neoplasm of unspecified site of unspecified female breast: Secondary | ICD-10-CM

## 2012-07-20 LAB — CBC WITH DIFFERENTIAL/PLATELET
HGB: 15.9 g/dL (ref 11.6–15.9)
MCH: 34.8 pg — ABNORMAL HIGH (ref 25.1–34.0)
MCHC: 34.4 g/dL (ref 31.5–36.0)
RBC: 4.57 10*6/uL (ref 3.70–5.45)
RDW: 13.8 % (ref 11.2–14.5)
lymph#: 1.7 10*3/uL (ref 0.9–3.3)

## 2012-07-20 LAB — COMPREHENSIVE METABOLIC PANEL (CC13)
AST: 20 U/L (ref 5–34)
Albumin: 3.3 g/dL — ABNORMAL LOW (ref 3.5–5.0)
BUN: 13 mg/dL (ref 7.0–26.0)
CO2: 26 mEq/L (ref 22–29)
Calcium: 8.9 mg/dL (ref 8.4–10.4)
Chloride: 103 mEq/L (ref 98–107)
Creatinine: 1 mg/dL (ref 0.6–1.1)
Glucose: 196 mg/dl — ABNORMAL HIGH (ref 70–99)
Potassium: 4.8 mEq/L (ref 3.5–5.1)

## 2012-07-20 NOTE — Progress Notes (Signed)
ID: Lindsey Keller   DOB: 02/24/1956  MR#: [id number]  WUJ#:[id number]  PCP: Lindsey Keller GYN:  SU:  OTHER MD: Lindsey Keller   HISTORY OF PRESENT ILLNESS: The patient underwent screening mammography at [facility] in [facility] on 09/06.  This showed a new nidus of microcalcifications in the left breast.  She returned for additional mammographic views on 09/13 which confirmed the presence of a cluster of microcalcifications which was not present on the previous studies. Accordingly, the patient underwent stereotactically guided core needle biopsy.  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  The tumor was tested for prognostic factors and found to be 100 percent estrogen receptor positive and 70 percent progesterone receptor positive.  She was subsequently seen by Lindsey Keller who performed a needle localized partial mastectomy on 05/08/04.  This specimen contained a small focus of residual DCIS measuring 1.5 mm.  Presumably, a large portion of the DCIS was removed by the Mammotome biopsy.  Within the lumpectomy specimen, the cluster of DCIS approached to within 8 mm of the closest surgical margin.  Her subsequent treatment is as detailed below.  INTERVAL HISTORY: Lindsey Keller returns today for routine followup of her noninvasive breast cancer. She tells me the interval history is unremarkable. She is currently on, which is the reason she prefers to see [place] in December. She is not exercising regularly but what I do a lot of walking at work".  REVIEW OF SYSTEMS: She tells me her depression is currently well-controlled. She does not see a counselor or psychiatrist for this. She tells me also that her diabetes of results when she discontinued her study medication. Otherwise a detailed review of systems today was entirely negative.  PAST MEDICAL HISTORY: Past Medical History  Diagnosis Date  . Depression   . Other psoriasis      involving hands only  . Obesity   . Glucose intolerance (pre-diabetes)     PAST SURGICAL HISTORY: Past Surgical History  Procedure Date  . Pilonidal cyst excision   . Partial hysterectomy     no salpingo-oophorectomy    FAMILY HISTORY The patient is adopted and does not know her biological family's medical history.    GYNECOLOGIC HISTORY: menarche at age 56. GX P0. She used oral contraceptives for 12 years until age 56.  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 Her husband of 27 years, "Lindsey Keller", is a retired Emergency planning/management officer currently working for [employer]. Son Lindsey Keller is currently in college of an invasive "terrific baseball player, waiting to be drafted". Daughter Lindsey Keller is a Holiday representative in [employer]. Both children are adopted. The patient is not a church attender.   ADVANCED DIRECTIVES:  HEALTH MAINTENANCE: History  Substance Use Topics  . Smoking status: Not on file  . Smokeless tobacco: Not on file  . Alcohol Use: Not on file     Colonoscopy:  PAP:  Bone density:  Lipid panel:  No Known Allergies  Current Outpatient Prescriptions  Medication Sig Dispense Refill  . calcium carbonate (OS-CAL - DOSED IN MG OF ELEMENTAL CALCIUM) 1250 MG tablet Take 1 tablet by mouth daily.        . cholecalciferol (VITAMIN D) 1000 UNITS tablet Take 1,000 Units by mouth daily.        Marland Kitchen  venlafaxine (EFFEXOR-XR) 75 MG 24 hr capsule Take 75 mg by mouth daily.          OBJECTIVE: Middle-aged white woman in no acute distress Filed Vitals:   07/20/12 0928  BP: 113/77  Pulse: 108  Temp: 97.8 F (36.6 C)  Resp: 20     Body mass index is 44.19 kg/(m^2).    ECOG FS: 0  Sclerae unicteric Oropharynx clear No cervical or supraclavicular adenopathy Lungs no rales or rhonchi Heart regular rate  and rhythm Abd benign MSK no focal spinal tenderness, no peripheral edema Neuro: nonfocal Breasts: The right breast is unremarkable. The left breast is status post lumpectomy. There is no evidence of local recurrence. The left axilla is benign. Skin: there is an eczematous rash over the dorsum of both hands.   LAB RESULTS: Lab Results  Component Value Date   WBC 5.9 07/20/2012   NEUTROABS 3.7 07/20/2012   HGB 15.9 07/20/2012   HCT 46.3 07/20/2012   MCV 101.3* 07/20/2012   PLT 253 07/20/2012      Chemistry      Component Value Date/Time   NA 140 07/20/2012 0902   NA 139 07/11/2011 1121   NA 139 07/11/2011 1121   K 4.8 07/20/2012 0902   K 4.6 07/11/2011 1121   K 4.6 07/11/2011 1121   CL 103 07/20/2012 0902   CL 102 07/11/2011 1121   CL 102 07/11/2011 1121   CO2 26 07/20/2012 0902   CO2 26 07/11/2011 1121   CO2 26 07/11/2011 1121   BUN 13.0 07/20/2012 0902   BUN 13 07/11/2011 1121   BUN 13 07/11/2011 1121   CREATININE 1.0 07/20/2012 0902   CREATININE 0.72 07/11/2011 1121   CREATININE 0.72 07/11/2011 1121      Component Value Date/Time   CALCIUM 8.9 07/20/2012 0902   CALCIUM 9.3 07/11/2011 1121   CALCIUM 9.3 07/11/2011 1121   ALKPHOS 111 07/20/2012 0902   ALKPHOS 101 07/11/2011 1121   ALKPHOS 101 07/11/2011 1121   AST 20 07/20/2012 0902   AST 20 07/11/2011 1121   AST 20 07/11/2011 1121   ALT 27 07/20/2012 0902   ALT 21 07/11/2011 1121   ALT 21 07/11/2011 1121   BILITOT 0.45 07/20/2012 0902   BILITOT 0.5 07/11/2011 1121   BILITOT 0.5 07/11/2011 1121       No results found for this basename: LABCA2    No components found with this basename: LABCA125    No results found for this basename: INR:1;PROTIME:1 in the last 168 hours  Urinalysis No results found for this basename: colorurine,  appearanceur,  labspec,  phurine,  glucoseu,  hgbur,  bilirubinur,  ketonesur,  proteinur,  urobilinogen,  nitrite,  leukocytesur    STUDIES: No results  found.  ASSESSMENT: 56 y.o. 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  (1) status post radiation to the entire left breast with a boost to the surgical cavity completed 07/24/2004  (2) enrolled in NSABP B-35, randomized to tamoxifen versus anastrozole, continued for 5 years, completed December 2010.    PLAN: From a breast cancer point of view the patient is doing well and if we were not following her for this study she would have been released to her primary care physician 3 years ago. She tells me she will need a colonoscopy this summer. I discussed genetics testing with her and I have requested a  preliminary review by our genetics counselor. Of course if Nolene were BRCA positive she would need bilateral salpingo-oophorectomy and she would need yearly MRIs in addition to mammography. Finally I discussed referral to a dermatologist but she declines.  She will see [place] again in one year. She knows to call for any problems that may develop before that visit.   Jaiveer Panas C    07/20/2012

## 2012-07-20 NOTE — Telephone Encounter (Signed)
Made patient appointment for 2014

## 2012-07-21 ENCOUNTER — Other Ambulatory Visit: Payer: Self-pay | Admitting: Oncology

## 2012-07-24 ENCOUNTER — Ambulatory Visit: Payer: 59 | Admitting: Oncology

## 2012-08-05 ENCOUNTER — Encounter: Payer: Self-pay | Admitting: Oncology

## 2012-08-05 NOTE — Progress Notes (Addendum)
Patient's care has been transferred from Dr. Donnie Coffin to Dr. Darnelle Catalan.

## 2012-11-12 DIAGNOSIS — J309 Allergic rhinitis, unspecified: Secondary | ICD-10-CM | POA: Insufficient documentation

## 2013-02-25 ENCOUNTER — Ambulatory Visit (INDEPENDENT_AMBULATORY_CARE_PROVIDER_SITE_OTHER): Payer: BC Managed Care – PPO

## 2013-02-25 ENCOUNTER — Other Ambulatory Visit: Payer: Self-pay | Admitting: Family Medicine

## 2013-02-25 ENCOUNTER — Ambulatory Visit (HOSPITAL_BASED_OUTPATIENT_CLINIC_OR_DEPARTMENT_OTHER): Payer: 59

## 2013-02-25 DIAGNOSIS — Z1231 Encounter for screening mammogram for malignant neoplasm of breast: Secondary | ICD-10-CM

## 2013-04-16 DIAGNOSIS — F321 Major depressive disorder, single episode, moderate: Secondary | ICD-10-CM | POA: Insufficient documentation

## 2013-07-19 ENCOUNTER — Telehealth: Payer: Self-pay | Admitting: Oncology

## 2013-07-19 ENCOUNTER — Other Ambulatory Visit (HOSPITAL_BASED_OUTPATIENT_CLINIC_OR_DEPARTMENT_OTHER): Payer: BC Managed Care – PPO

## 2013-07-19 ENCOUNTER — Ambulatory Visit (HOSPITAL_BASED_OUTPATIENT_CLINIC_OR_DEPARTMENT_OTHER): Payer: BC Managed Care – PPO | Admitting: Oncology

## 2013-07-19 VITALS — BP 115/78 | HR 103 | Temp 97.9°F | Resp 20 | Ht 62.5 in | Wt 249.3 lb

## 2013-07-19 DIAGNOSIS — D0590 Unspecified type of carcinoma in situ of unspecified breast: Secondary | ICD-10-CM

## 2013-07-19 DIAGNOSIS — D0510 Intraductal carcinoma in situ of unspecified breast: Secondary | ICD-10-CM

## 2013-07-19 LAB — CBC WITH DIFFERENTIAL/PLATELET
BASO%: 0.8 % (ref 0.0–2.0)
Basophils Absolute: 0.1 10*3/uL (ref 0.0–0.1)
EOS%: 2.8 % (ref 0.0–7.0)
HGB: 15.7 g/dL (ref 11.6–15.9)
MCH: 33.7 pg (ref 25.1–34.0)
MONO#: 0.4 10*3/uL (ref 0.1–0.9)
NEUT#: 4.7 10*3/uL (ref 1.5–6.5)
RDW: 12.9 % (ref 11.2–14.5)
WBC: 7.2 10*3/uL (ref 3.9–10.3)
lymph#: 1.9 10*3/uL (ref 0.9–3.3)

## 2013-07-19 NOTE — Progress Notes (Signed)
ID: Lindsey Keller   DOB: Nov 26, 1955  MR#: [id number]  WUJ#:[id number]  PCP: Rosario Adie GYN:  SU:  OTHER MD: Margaretmary Dys   HISTORY OF PRESENT ILLNESS: The patient underwent screening mammography at [facility] in [place] on 09/06.  This showed a new nidus of microcalcifications in the left breast.  She returned for additional mammographic views on 09/13 which confirmed the presence of a cluster of microcalcifications which was not present on the previous studies. Accordingly, the patient underwent stereotactically guided core needle biopsy.  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  The tumor was tested for prognostic factors and found to be 100 percent estrogen receptor positive and 70 percent progesterone receptor positive.  She was subsequently seen by [name] who performed a needle localized partial mastectomy on 05/08/04.  This specimen contained a small focus of residual DCIS measuring 1.5 mm.  Presumably, a large portion of the DCIS was removed by the Mammotome biopsy.  Within the lumpectomy specimen, the cluster of DCIS approached to within 8 mm of the closest surgical margin.  Her subsequent treatment is as detailed below.  INTERVAL HISTORY: [name] returns today for followup of her breast cancer. Unfortunately her mother was 3 years old and had been in a nursing home died within the last week. Lindsey Keller is grieving appropriately. She is also continuing to work full-time.  REVIEW OF SYSTEMS: She has developed back spasms which can be very severe. She has not mentioned these to her primary care physician. She denies unusual headaches, visual changes, cough, or phlegm production. She does have some difficulty swallowing, and has scheduled herself not only for a colonoscopy but also an upper endoscopy [name]. She continues to have depressive symptoms, but again she is very dedicated to  her work and family. He tells me her diabetes is "more or less under control". A detailed review of systems today was otherwise noncontributory  PAST MEDICAL HISTORY: Past Medical History  Diagnosis Date  . Depression   . Other psoriasis     involving hands only  . Obesity   . Glucose intolerance (pre-diabetes)     PAST SURGICAL HISTORY: Past Surgical History  Procedure Laterality Date  . Pilonidal cyst excision    . Partial hysterectomy      no salpingo-oophorectomy    FAMILY HISTORY The patient is adopted and does not know her biological family's medical history.    GYNECOLOGIC HISTORY: menarche at age 17. GX P0. She used oral contraceptives for 12 years until age 57.  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 Her husband of 27 years, "[name]", is a retired Emergency planning/management officer currently working for [employer]. Son [name] is currently in college of an invasive "terrific baseball player, waiting to be drafted". Daughter [name] is a Holiday representative in [employer]. Both children are adopted. The patient is not a church attender.   ADVANCED DIRECTIVES:  HEALTH MAINTENANCE: History  Substance Use Topics  . Smoking status: Not on file  . Smokeless tobacco: Not on file  . Alcohol Use: Not on file     Colonoscopy:  PAP:  Bone density:  Lipid panel:  No Known Allergies  Current Outpatient Prescriptions  Medication Sig Dispense Refill  . calcium  carbonate (OS-CAL - DOSED IN MG OF ELEMENTAL CALCIUM) 1250 MG tablet Take 1 tablet by mouth daily.        . cholecalciferol (VITAMIN D) 1000 UNITS tablet Take 1,000 Units by mouth daily.        Marland Kitchen venlafaxine (EFFEXOR-XR) 75 MG 24 hr capsule Take 75 mg by mouth daily.         No current facility-administered medications for this visit.    OBJECTIVE:  Middle-aged white woman who appears stated age  12 Vitals:   07/19/13 0854  BP: 115/78  Pulse: 103  Temp: 97.9 F (36.6 C)  Resp: 20     Body mass index is 44.84 kg/(m^2).    ECOG FS: 1 Sclerae unicteric, pupils equal and reactive Oropharynx clear and moist-- no thrush No cervical or supraclavicular adenopathy Lungs no rales or rhonchi Heart regular rate and rhythm Abd soft, obese, nontender, positive bowel sounds MSK no focal spinal tenderness including palpation and percussion of the lower spine Neuro: nonfocal, well oriented, appropriate affect Breasts: The right breast is unremarkable. The left breast is status post lumpectomy and radiation. There is no evidence of local recurrence. The left axilla is benign.    LAB RESULTS: Lab Results  Component Value Date   WBC 7.2 07/19/2013   NEUTROABS 4.7 07/19/2013   HGB 15.7 07/19/2013   HCT 46.0 07/19/2013   MCV 98.5 07/19/2013   PLT 267 07/19/2013      Chemistry      Component Value Date/Time   NA 140 07/20/2012 0902   NA 139 07/11/2011 1121   K 4.8 07/20/2012 0902   K 4.6 07/11/2011 1121   CL 103 07/20/2012 0902   CL 102 07/11/2011 1121   CO2 26 07/20/2012 0902   CO2 26 07/11/2011 1121   BUN 13.0 07/20/2012 0902   BUN 13 07/11/2011 1121   CREATININE 1.0 07/20/2012 0902   CREATININE 0.72 07/11/2011 1121      Component Value Date/Time   CALCIUM 8.9 07/20/2012 0902   CALCIUM 9.3 07/11/2011 1121   ALKPHOS 111 07/20/2012 0902   ALKPHOS 101 07/11/2011 1121   AST 20 07/20/2012 0902   AST 20 07/11/2011 1121   ALT 27 07/20/2012 0902   ALT 21 07/11/2011 1121   BILITOT 0.45 07/20/2012 0902   BILITOT 0.5 07/11/2011 1121       Lab Results  Component Value Date   LABCA2 22 07/20/2012    No components found with this basename: [id number]    No results found for this basename: INR,  in the last 168 hours  Urinalysis No results found for this basename: colorurine,  appearanceur,  labspec,  phurine,  glucoseu,   hgbur,  bilirubinur,  ketonesur,  proteinur,  urobilinogen,  nitrite,  leukocytesur    STUDIES: Mammography 02/25/2013 was unremarkable  ASSESSMENT: 57 y.o. 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  (1) status post radiation to the entire left breast with a boost to the surgical cavity completed 07/24/2004  (2) enrolled in NSABP B-35, randomized to tamoxifen versus anastrozole, continued for 5 years, completed December 2010.    PLAN: Vinie is doing fine from a breast cancer point of view. We're going to see her one more time a year from now and she will "graduate" at that visit.  I have asked her to mention her low back spasms to her primary care physician for further  evaluation. These are not going to be related to her cancer, which was noninvasive.  I am concerned that she is going to continue to develop back pain and knee pain symptoms which are going to make it impossible for her to exercise, leading to further weight gain, leading to worsening arthritis problems. I gave her a list strong pamphlet and strongly encouraged her to start water aerobics and wrote recumbent bike exercises. Hopefully she can turn her physical health around so when I see her a year from now she will have note only lost some weight but better control of her diabetes and overall functional status  Daffney Greenly C    07/19/2013

## 2013-07-20 ENCOUNTER — Other Ambulatory Visit: Payer: BC Managed Care – PPO | Admitting: Lab

## 2014-02-21 DIAGNOSIS — M48061 Spinal stenosis, lumbar region without neurogenic claudication: Secondary | ICD-10-CM | POA: Insufficient documentation

## 2014-02-28 ENCOUNTER — Ambulatory Visit (HOSPITAL_BASED_OUTPATIENT_CLINIC_OR_DEPARTMENT_OTHER)
Admission: RE | Admit: 2014-02-28 | Discharge: 2014-02-28 | Disposition: A | Discharge status: Home / Self Care | Payer: BC Managed Care – PPO | Source: Ambulatory Visit | Attending: Family Medicine | Admitting: Family Medicine

## 2014-02-28 ENCOUNTER — Other Ambulatory Visit (HOSPITAL_BASED_OUTPATIENT_CLINIC_OR_DEPARTMENT_OTHER): Payer: Self-pay | Admitting: Family Medicine

## 2014-02-28 DIAGNOSIS — Z1231 Encounter for screening mammogram for malignant neoplasm of breast: Secondary | ICD-10-CM

## 2014-03-10 ENCOUNTER — Other Ambulatory Visit: Payer: Self-pay | Admitting: Neurosurgery

## 2014-03-10 DIAGNOSIS — M48061 Spinal stenosis, lumbar region without neurogenic claudication: Secondary | ICD-10-CM

## 2014-03-11 ENCOUNTER — Ambulatory Visit
Admission: RE | Admit: 2014-03-11 | Discharge: 2014-03-11 | Disposition: A | Discharge status: Home / Self Care | Payer: BC Managed Care – PPO | Source: Ambulatory Visit | Attending: Neurosurgery | Admitting: Neurosurgery

## 2014-03-11 DIAGNOSIS — M48061 Spinal stenosis, lumbar region without neurogenic claudication: Secondary | ICD-10-CM

## 2014-03-11 MED ORDER — IOHEXOL 180 MG/ML  SOLN
1.0000 mL | Freq: Once | INTRAMUSCULAR | Status: AC | PRN
  Administered 2014-03-11: 1 mL via EPIDURAL

## 2014-03-11 MED ORDER — METHYLPREDNISOLONE ACETATE 40 MG/ML INJ SUSP (RADIOLOG
120.0000 mg | Freq: Once | INTRAMUSCULAR | Status: AC
  Administered 2014-03-11: 120 mg via EPIDURAL

## 2014-03-11 NOTE — Discharge Instructions (Signed)

## 2014-07-20 ENCOUNTER — Other Ambulatory Visit: Payer: Self-pay | Admitting: *Deleted

## 2014-07-25 ENCOUNTER — Telehealth: Payer: Self-pay | Admitting: Oncology

## 2014-07-25 ENCOUNTER — Ambulatory Visit (HOSPITAL_BASED_OUTPATIENT_CLINIC_OR_DEPARTMENT_OTHER): Payer: BC Managed Care – PPO | Admitting: Oncology

## 2014-07-25 ENCOUNTER — Other Ambulatory Visit: Payer: BC Managed Care – PPO

## 2014-07-25 ENCOUNTER — Other Ambulatory Visit (HOSPITAL_BASED_OUTPATIENT_CLINIC_OR_DEPARTMENT_OTHER): Payer: BC Managed Care – PPO

## 2014-07-25 VITALS — BP 146/97 | HR 85 | Temp 98.0°F | Resp 18 | Ht 62.5 in | Wt 255.0 lb

## 2014-07-25 DIAGNOSIS — E1165 Type 2 diabetes mellitus with hyperglycemia: Secondary | ICD-10-CM

## 2014-07-25 DIAGNOSIS — C50919 Malignant neoplasm of unspecified site of unspecified female breast: Secondary | ICD-10-CM

## 2014-07-25 DIAGNOSIS — D0510 Intraductal carcinoma in situ of unspecified breast: Secondary | ICD-10-CM

## 2014-07-25 DIAGNOSIS — Z853 Personal history of malignant neoplasm of breast: Secondary | ICD-10-CM

## 2014-07-25 LAB — COMPREHENSIVE METABOLIC PANEL (CC13)
ALBUMIN: 3.4 g/dL — AB (ref 3.5–5.0)
ALT: 29 U/L (ref 0–55)
ANION GAP: 8 meq/L (ref 3–11)
AST: 22 U/L (ref 5–34)
Alkaline Phosphatase: 111 U/L (ref 40–150)
BUN: 13.8 mg/dL (ref 7.0–26.0)
CALCIUM: 8.9 mg/dL (ref 8.4–10.4)
CHLORIDE: 104 meq/L (ref 98–109)
CO2: 28 mEq/L (ref 22–29)
Creatinine: 0.9 mg/dL (ref 0.6–1.1)
EGFR: 73 mL/min/{1.73_m2} — ABNORMAL LOW (ref >90)
Glucose: 113 mg/dl (ref 70–140)
Potassium: 4.4 mEq/L (ref 3.5–5.1)
Sodium: 140 mEq/L (ref 136–145)
Total Bilirubin: 0.53 mg/dL (ref 0.20–1.20)
Total Protein: 7 g/dL (ref 6.4–8.3)

## 2014-07-25 LAB — CBC WITH DIFFERENTIAL/PLATELET
BASO%: 0.7 % (ref 0.0–2.0)
Basophils Absolute: 0 10*3/uL (ref 0.0–0.1)
EOS%: 3.9 % (ref 0.0–7.0)
Eosinophils Absolute: 0.2 10*3/uL (ref 0.0–0.5)
HCT: 46.3 % (ref 34.8–46.6)
HGB: 15.1 g/dL (ref 11.6–15.9)
LYMPH#: 1.5 10*3/uL (ref 0.9–3.3)
LYMPH%: 24.6 % (ref 14.0–49.7)
MCH: 32.9 pg (ref 25.1–34.0)
MCHC: 32.5 g/dL (ref 31.5–36.0)
MCV: 101.1 fL — AB (ref 79.5–101.0)
MONO#: 0.3 10*3/uL (ref 0.1–0.9)
MONO%: 5.5 % (ref 0.0–14.0)
NEUT%: 65.3 % (ref 38.4–76.8)
NEUTROS ABS: 4 10*3/uL (ref 1.5–6.5)
Platelets: 260 10*3/uL (ref 145–400)
RBC: 4.58 10*6/uL (ref 3.70–5.45)
RDW: 13.3 % (ref 11.2–14.5)
WBC: 6.2 10*3/uL (ref 3.9–10.3)

## 2014-07-25 NOTE — Telephone Encounter (Signed)
pt here for lab/MD appt-labs CX-adv GM no lab orders in system-stated he will add orders-sch lab-Linda in registration stated she will arrive pt

## 2014-07-25 NOTE — Progress Notes (Signed)
ID: JOLEY UTECHT   DOB: 05/11/1956  MR#: [id number]  ZTI#:[id number]  PCP: Miguel Aschoff GYN:  SU:  OTHER MD: Tyler Pita   HISTORY OF PRESENT ILLNESS: From the original intake note:  The patient underwent screening mammography at [facility] in [place] on 09/06.  This showed a new nidus of microcalcifications in the left breast.  She returned for additional mammographic views on 09/13 which confirmed the presence of a cluster of microcalcifications which was not present on the previous studies. Accordingly, the patient underwent stereotactically guided core needle biopsy.  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  The tumor was tested for prognostic factors and found to be 100 percent estrogen receptor positive and 70 percent progesterone receptor positive.  She was subsequently seen by Marylene Buerger who performed a needle localized partial mastectomy on 05/08/04.  This specimen contained a small focus of residual DCIS measuring 1.5 mm.  Presumably, a large portion of the DCIS was removed by the Mammotome biopsy.  Within the lumpectomy specimen, the cluster of DCIS approached to within 8 mm of the closest surgical margin.  Her subsequent treatment is as detailed below.  INTERVAL HISTORY: Ashara returns today for followup of her breast cancer. The interval history is generally unremarkable. She continues to teach and enjoys doing. She does not otherwise exercise on a regular basis.  REVIEW OF SYSTEMS: She has some night sweats at times and some hot flashes as well during the day. She worries about her weight. She has pain in her back and knees and hips. This makes it a little hard for her to walk any distance. She feels depressed. She tells me her diabetes is under good control. A detailed review of systems today was otherwise noncontributory  PAST MEDICAL HISTORY: Past Medical History  Diagnosis Date  .  Depression   . Other psoriasis     involving hands only  . Obesity   . Glucose intolerance (pre-diabetes)     PAST SURGICAL HISTORY: Past Surgical History  Procedure Laterality Date  . Pilonidal cyst excision    . Partial hysterectomy      no salpingo-oophorectomy    FAMILY HISTORY The patient is adopted and does not know her biological family's medical history.    GYNECOLOGIC HISTORY: menarche at age 76. GX P0. She used oral contraceptives for 12 years until age 69.  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 Her husband of 47 years, "Theadore Nan", is a retired Engineer, structural currently working for [employer]. Son Lennette Bihari is currently in college of an invasive "terrific baseball player, waiting to be drafted". Daughter Jinny Blossom is a Paramedic in [employer]. Both children are adopted. The patient is not a church attender.   ADVANCED DIRECTIVES:  HEALTH MAINTENANCE: History  Substance Use Topics  . Smoking status: Not on file  . Smokeless tobacco: Not on file  . Alcohol Use: Not on file     Colonoscopy:  PAP:  Bone density: July 2013; normal  Lipid panel:  Allergies  Allergen Reactions  . Adhesive [Tape] Itching  . Bextra [Valdecoxib] Swelling    Facial swelling  . Keflex [Cephalexin]     Body aches  . Latex Itching    Current Outpatient Prescriptions  Medication  Sig Dispense Refill  . calcium carbonate (OS-CAL - DOSED IN MG OF ELEMENTAL CALCIUM) 1250 MG tablet Take 1 tablet by mouth daily.      . cholecalciferol (VITAMIN D) 1000 UNITS tablet Take 1,000 Units by mouth daily.      Marland Kitchen venlafaxine (EFFEXOR-XR) 75 MG 24 hr capsule Take 75 mg by mouth daily.       No current facility-administered medications for this visit.    OBJECTIVE: Middle-aged white woman in no acute  distress Filed Vitals:   07/25/14 0924  BP: 146/97  Pulse: 85  Temp: 98 F (36.7 C)  Resp: 18     Body mass index is 45.87 kg/(m^2).    ECOG FS: 1  Sclerae unicteric, pupils round and equal Oropharynx clear, no thrush or other lesions No cervical or supraclavicular adenopathy Lungs no rales or rhonchi Heart regular rate and rhythm Abd soft, obese, nontender, positive bowel sounds MSK no focal spinal tenderness, no upper extremity lymphedema Neuro: nonfocal, well oriented, appropriate affect Breasts: The right breast is unremarkable. The left breast is status post lumpectomy and radiation. There is no evidence of local recurrence. The left axilla is benign.    LAB RESULTS: Lab Results  Component Value Date   WBC 6.2 07/25/2014   NEUTROABS 4.0 07/25/2014   HGB 15.1 07/25/2014   HCT 46.3 07/25/2014   MCV 101.1* 07/25/2014   PLT 260 07/25/2014      Chemistry      Component Value Date/Time   NA 140 07/20/2012 0902   NA 139 07/11/2011 1121   K 4.8 07/20/2012 0902   K 4.6 07/11/2011 1121   CL 103 07/20/2012 0902   CL 102 07/11/2011 1121   CO2 26 07/20/2012 0902   CO2 26 07/11/2011 1121   BUN 13.0 07/20/2012 0902   BUN 13 07/11/2011 1121   CREATININE 1.0 07/20/2012 0902   CREATININE 0.72 07/11/2011 1121      Component Value Date/Time   CALCIUM 8.9 07/20/2012 0902   CALCIUM 9.3 07/11/2011 1121   ALKPHOS 111 07/20/2012 0902   ALKPHOS 101 07/11/2011 1121   AST 20 07/20/2012 0902   AST 20 07/11/2011 1121   ALT 27 07/20/2012 0902   ALT 21 07/11/2011 1121   BILITOT 0.45 07/20/2012 0902   BILITOT 0.5 07/11/2011 1121       Lab Results  Component Value Date   LABCA2 22 07/20/2012    No components found for: [id number]  No results for input(s): INR in the last 168 hours.  Urinalysis No results found for: COLORURINE  STUDIES: CLINICAL DATA: Screening.  EXAM: DIGITAL SCREENING BILATERAL MAMMOGRAM WITH CAD  COMPARISON: Previous exam(s).  ACR Breast  Density Category b: There are scattered areas of fibroglandular density.  FINDINGS: There are no findings suspicious for malignancy. Images were processed with CAD.  IMPRESSION: No mammographic evidence of malignancy. A result letter of this screening mammogram will be mailed directly to the patient.  RECOMMENDATION: Screening mammogram in one year. (Code:[id number]-B-01Y)  BI-RADS CATEGORY 1: Negative.   Electronically Signed  By: Shon Hale M.D.  On: 03/02/2014 08:16   ASSESSMENT: 58 y.o. 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  (1) status post radiation to the entire left breast with a boost to the surgical cavity completed 07/24/2004  (2) enrolled in NSABP B-35, randomized to tamoxifen versus anastrozole, continued for 5 years, completed December 2010.  PLAN: Jazaria has completed 10 years of follow-up for her noninvasive breast cancer. This was not a life-threatening tumor. At this point I'm very comfortable releasing her to her primary care physician.  All she will need in terms of breast cancer follow-up is a yearly physician breast exam and yearly mammography.  Lashauna understands that I will be glad to see her at any point in the future if and when the need arises. At this point however we are not making any further routine appointments for her here.          MAGRINAT,GUSTAV C    07/25/2014

## 2015-01-19 ENCOUNTER — Other Ambulatory Visit: Payer: Self-pay | Admitting: General Surgery

## 2015-03-01 ENCOUNTER — Encounter: Payer: BLUE CROSS/BLUE SHIELD | Attending: General Surgery | Admitting: Dietician

## 2015-03-01 ENCOUNTER — Encounter: Payer: Self-pay | Admitting: Dietician

## 2015-03-01 DIAGNOSIS — Z713 Dietary counseling and surveillance: Secondary | ICD-10-CM | POA: Diagnosis not present

## 2015-03-01 DIAGNOSIS — Z6841 Body Mass Index (BMI) 40.0 and over, adult: Secondary | ICD-10-CM | POA: Diagnosis not present

## 2015-03-01 NOTE — Progress Notes (Signed)
  Pre-Op Assessment Visit:  Pre-Operative RYGB Surgery  Medical Nutrition Therapy:  Appt start time: 160   End time:  530.  Patient was seen on 03/01/2015 for Pre-Operative Nutrition Assessment. Assessment and letter of approval faxed to [facility] Bariatric Surgery Program coordinator on 03/01/2015.   Preferred Learning Style:   No preference indicated   Learning Readiness:   Ready  Handouts given during visit include:  Pre-Op Goals Bariatric Surgery Protein Shakes   During the appointment today the following Pre-Op Goals were reviewed with the patient: Maintain or lose weight as instructed by your surgeon Make healthy food choices Begin to limit portion sizes Limited concentrated sugars and fried foods Keep fat/sugar in the single digits per serving on   food labels Practice CHEWING your food  (aim for 30 chews per bite or until applesauce consistency) Practice not drinking 15 minutes before, during, and 30 minutes after each meal/snack Avoid all carbonated beverages  Avoid/limit caffeinated beverages  Avoid all sugar-sweetened beverages Consume 3 meals per day; eat every 3-5 hours Make a list of non-food related activities Aim for 64-100 ounces of FLUID daily  Aim for at least 60-80 grams of PROTEIN daily Look for a liquid protein source that contain ?15 g protein and ?5 g carbohydrate  (ex: shakes, drinks, shots)  Patient-Centered Goals: -Knee and back pain relief (possible knee replacement) -Getting out of beach chair on her own -Sitting on the floor comfortably  -Return to teaching  Demonstrated degree of understanding via:  Teach Back  Teaching Method Utilized:  Visual Auditory Hands on  Barriers to learning/adherence to lifestyle change: none  [facility] to call the [facility] to enroll in Pre-Op and Post-Op Nutrition Education when surgery date is scheduled.

## 2015-03-03 ENCOUNTER — Ambulatory Visit (HOSPITAL_BASED_OUTPATIENT_CLINIC_OR_DEPARTMENT_OTHER)
Admission: RE | Admit: 2015-03-03 | Discharge: 2015-03-03 | Disposition: A | Discharge status: Home / Self Care | Payer: BLUE CROSS/BLUE SHIELD | Source: Ambulatory Visit | Attending: Family Medicine | Admitting: Family Medicine

## 2015-03-03 ENCOUNTER — Other Ambulatory Visit (HOSPITAL_BASED_OUTPATIENT_CLINIC_OR_DEPARTMENT_OTHER): Payer: Self-pay | Admitting: Family Medicine

## 2015-03-03 ENCOUNTER — Ambulatory Visit (HOSPITAL_BASED_OUTPATIENT_CLINIC_OR_DEPARTMENT_OTHER): Payer: BC Managed Care – PPO

## 2015-03-03 DIAGNOSIS — Z1231 Encounter for screening mammogram for malignant neoplasm of breast: Secondary | ICD-10-CM | POA: Diagnosis present

## 2015-03-03 DIAGNOSIS — Z853 Personal history of malignant neoplasm of breast: Secondary | ICD-10-CM | POA: Insufficient documentation

## 2015-03-09 ENCOUNTER — Other Ambulatory Visit: Payer: Self-pay

## 2015-03-09 ENCOUNTER — Ambulatory Visit (HOSPITAL_COMMUNITY)
Admission: RE | Admit: 2015-03-09 | Discharge: 2015-03-09 | Disposition: A | Discharge status: Home / Self Care | Payer: BLUE CROSS/BLUE SHIELD | Source: Ambulatory Visit | Attending: General Surgery | Admitting: General Surgery

## 2015-03-09 DIAGNOSIS — K8020 Calculus of gallbladder without cholecystitis without obstruction: Secondary | ICD-10-CM | POA: Insufficient documentation

## 2015-03-09 DIAGNOSIS — K2270 Barrett's esophagus without dysplasia: Secondary | ICD-10-CM | POA: Diagnosis not present

## 2015-03-09 DIAGNOSIS — Z86000 Personal history of in-situ neoplasm of breast: Secondary | ICD-10-CM | POA: Insufficient documentation

## 2015-03-09 DIAGNOSIS — J449 Chronic obstructive pulmonary disease, unspecified: Secondary | ICD-10-CM | POA: Insufficient documentation

## 2015-03-09 DIAGNOSIS — Z853 Personal history of malignant neoplasm of breast: Secondary | ICD-10-CM | POA: Diagnosis not present

## 2015-03-09 DIAGNOSIS — F329 Major depressive disorder, single episode, unspecified: Secondary | ICD-10-CM | POA: Diagnosis not present

## 2015-03-09 DIAGNOSIS — K449 Diaphragmatic hernia without obstruction or gangrene: Secondary | ICD-10-CM | POA: Diagnosis not present

## 2015-03-09 DIAGNOSIS — M5134 Other intervertebral disc degeneration, thoracic region: Secondary | ICD-10-CM | POA: Insufficient documentation

## 2015-03-09 DIAGNOSIS — K219 Gastro-esophageal reflux disease without esophagitis: Secondary | ICD-10-CM | POA: Diagnosis not present

## 2015-03-09 DIAGNOSIS — K760 Fatty (change of) liver, not elsewhere classified: Secondary | ICD-10-CM | POA: Insufficient documentation

## 2015-03-09 DIAGNOSIS — E119 Type 2 diabetes mellitus without complications: Secondary | ICD-10-CM | POA: Diagnosis not present

## 2015-03-09 DIAGNOSIS — Z6841 Body Mass Index (BMI) 40.0 and over, adult: Secondary | ICD-10-CM | POA: Insufficient documentation

## 2015-03-09 DIAGNOSIS — F17200 Nicotine dependence, unspecified, uncomplicated: Secondary | ICD-10-CM | POA: Diagnosis not present

## 2015-03-09 DIAGNOSIS — Z87891 Personal history of nicotine dependence: Secondary | ICD-10-CM | POA: Diagnosis not present

## 2015-04-10 ENCOUNTER — Encounter: Payer: BLUE CROSS/BLUE SHIELD | Attending: General Surgery

## 2015-04-10 DIAGNOSIS — Z713 Dietary counseling and surveillance: Secondary | ICD-10-CM | POA: Diagnosis not present

## 2015-04-10 DIAGNOSIS — Z6841 Body Mass Index (BMI) 40.0 and over, adult: Secondary | ICD-10-CM | POA: Diagnosis not present

## 2015-04-10 NOTE — Progress Notes (Signed)
Please put orders in Epic surgery 05-01-15 pre op 04-25-15 Thanks

## 2015-04-11 NOTE — Progress Notes (Signed)
  Pre-Operative Nutrition Class:  Appt start time: 1610   End time:  1830.  Patient was seen on 04/10/15 for Pre-Operative Bariatric Surgery Education at the [facility].   Surgery date: 05/01/15 Surgery type: RYGB Start weight at [facility]: 259 lbs on 03/01/15 Weight today: 258.9 lbs  TANITA  BODY COMP RESULTS  04/10/15   BMI (kg/m^2) N/A   Fat Mass (lbs)    Fat Free Mass (lbs)    Total Body Water (lbs)     The following the learning objectives were met by the patient during this course:  Identify Pre-Op Dietary Goals and will begin 2 weeks pre-operatively  Identify appropriate sources of fluids and proteins   State protein recommendations and appropriate sources pre and post-operatively  Identify Post-Operative Dietary Goals and will follow for 2 weeks post-operatively  Identify appropriate multivitamin and calcium sources  Describe the need for physical activity post-operatively and will follow MD recommendations  State when to call healthcare provider regarding medication questions or post-operative complications  Handouts given during class include:  Pre-Op Bariatric Surgery Diet Handout  Protein Shake Handout  Post-Op Bariatric Surgery Nutrition Handout  BELT Program Information Flyer  Support Group Information Flyer  [facility] Bariatric Supplements Price List  Follow-Up Plan: Patient will follow-up at [facility] 2 weeks post operatively for diet advancement per MD.

## 2015-04-15 ENCOUNTER — Ambulatory Visit: Payer: Self-pay | Admitting: General Surgery

## 2015-04-21 NOTE — Patient Instructions (Addendum)
YOUR PROCEDURE IS SCHEDULED ON : 05/01/15  REPORT TO [facility] MAIN ENTRANCE FOLLOW SIGNS TO EAST ELEVATOR - GO TO 3rd FLOOR  CHECK IN AT 3 EAST NURSES STATION (SHORT STAY) AT: 7:30 AM  CALL THIS NUMBER IF YOU HAVE PROBLEMS THE MORNING OF SURGERY [phone]  REMEMBER:ONLY 1 PER PERSON MAY GO TO SHORT STAY WITH YOU TO GET READY THE MORNING OF YOUR SURGERY  DO NOT EAT FOOD OR DRINK LIQUIDS AFTER MIDNIGHT  TAKE THESE MEDICINES THE MORNING OF SURGERY: OMEPRAZOLE / VENLAFAXINE ER  STOP ASPIRIN / IBUPROFEN / ALEVE / VITAMINS / HERBAL MEDS __7__ DAYS BEFORE SURGERY  STOP ESTROGEN / PROGESTERONE 4 WEEKS PREOP  FOLLOW BOWEL PREP FROM OFFICE  YOU MAY NOT HAVE ANY METAL ON YOUR BODY INCLUDING HAIR PINS AND PIERCING'S. DO NOT WEAR JEWELRY, MAKEUP, LOTIONS, POWDERS OR PERFUMES. DO NOT WEAR NAIL POLISH. DO NOT SHAVE 48 HRS PRIOR TO SURGERY. MEN MAY SHAVE FACE AND NECK.  DO NOT [place]. [facility] IS NOT RESPONSIBLE FOR VALUABLES.  CONTACTS, DENTURES OR PARTIALS MAY NOT BE WORN TO SURGERY. LEAVE SUITCASE IN CAR. CAN BE BROUGHT TO ROOM AFTER SURGERY.  PATIENTS DISCHARGED THE DAY OF SURGERY WILL NOT BE ALLOWED TO DRIVE HOME.  PLEASE READ OVER THE FOLLOWING INSTRUCTION SHEETS _________________________________________________________________________________                                          [facility] - PREPARING FOR SURGERY  Before surgery, you can play an important role.  Because skin is not sterile, your skin needs to be as free of germs as possible.  You can reduce the number of germs on your skin by washing with CHG (chlorahexidine gluconate) soap before surgery.  CHG is an antiseptic cleaner which kills germs and bonds with the skin to continue killing germs even after washing. Please DO NOT use if you have an allergy to CHG or antibacterial soaps.  If your skin becomes reddened/irritated stop using the CHG and inform your nurse when you  arrive at Short Stay. Do not shave (including legs and underarms) for at least 48 hours prior to the first CHG shower.  You may shave your face. Please follow these instructions carefully:   1.  Shower with CHG Soap the night before surgery and the  morning of Surgery.   2.  If you choose to wash your hair, wash your hair first as usual with your  normal  Shampoo.   3.  After you shampoo, rinse your hair and body thoroughly to remove the  shampoo.                                         4.  Use CHG as you would any other liquid soap.  You can apply chg directly  to the skin and wash . Gently wash with scrungie or clean wascloth    5.  Apply the CHG Soap to your body ONLY FROM THE NECK DOWN.   Do not use on open                           Wound or open sores. Avoid contact with eyes, ears mouth and genitals (private parts).  Genitals (private parts) with your normal soap.              6.  Wash thoroughly, paying special attention to the area where your surgery  will be performed.   7.  Thoroughly rinse your body with warm water from the neck down.   8.  DO NOT shower/wash with your normal soap after using and rinsing off  the CHG Soap .                9.  Pat yourself dry with a clean towel.             10.  Wear clean night clothes to bed after shower             11.  Place clean sheets on your bed the night of your first shower and do not  sleep with pets.  Day of Surgery : Do not apply any lotions/deodorants the morning of surgery.  Please wear clean clothes to the hospital/surgery center.  FAILURE TO FOLLOW THESE INSTRUCTIONS MAY RESULT IN THE CANCELLATION OF YOUR SURGERY    PATIENT SIGNATURE_________________________________  ______________________________________________________________________

## 2015-04-25 ENCOUNTER — Encounter (HOSPITAL_COMMUNITY): Payer: Self-pay

## 2015-04-25 ENCOUNTER — Encounter (HOSPITAL_COMMUNITY)
Admission: RE | Admit: 2015-04-25 | Discharge: 2015-04-25 | Disposition: A | Discharge status: Home / Self Care | Payer: BLUE CROSS/BLUE SHIELD | Source: Ambulatory Visit | Attending: General Surgery | Admitting: General Surgery

## 2015-04-25 DIAGNOSIS — Z01818 Encounter for other preprocedural examination: Secondary | ICD-10-CM | POA: Diagnosis not present

## 2015-04-25 HISTORY — DX: Irritable bowel syndrome, unspecified: K58.9

## 2015-04-25 HISTORY — DX: Adverse effect of unspecified anesthetic, initial encounter: T41.45XA

## 2015-04-25 HISTORY — DX: Barrett's esophagus without dysplasia: K22.70

## 2015-04-25 HISTORY — DX: Gastro-esophageal reflux disease without esophagitis: K21.9

## 2015-04-25 HISTORY — DX: Other complications of anesthesia, initial encounter: T88.59XA

## 2015-04-25 HISTORY — DX: Other intervertebral disc degeneration, lumbar region without mention of lumbar back pain or lower extremity pain: M51.369

## 2015-04-25 HISTORY — DX: Unspecified asthma, uncomplicated: J45.909

## 2015-04-25 HISTORY — DX: Circadian rhythm sleep disorder, unspecified type: G47.20

## 2015-04-25 HISTORY — DX: Other intervertebral disc degeneration, lumbar region: M51.36

## 2015-04-25 HISTORY — DX: Spinal stenosis, site unspecified: M48.00

## 2015-04-25 HISTORY — DX: Unspecified osteoarthritis, unspecified site: M19.90

## 2015-04-25 HISTORY — DX: Type 2 diabetes mellitus without complications: E11.9

## 2015-04-25 LAB — COMPREHENSIVE METABOLIC PANEL
ALK PHOS: 82 U/L (ref 38–126)
ALT: 36 U/L (ref 14–54)
AST: 32 U/L (ref 15–41)
Albumin: 4.1 g/dL (ref 3.5–5.0)
Anion gap: 7 (ref 5–15)
BILIRUBIN TOTAL: 0.3 mg/dL (ref 0.3–1.2)
BUN: 25 mg/dL — ABNORMAL HIGH (ref 6–20)
CALCIUM: 9.3 mg/dL (ref 8.9–10.3)
CO2: 26 mmol/L (ref 22–32)
CREATININE: 0.75 mg/dL (ref 0.44–1.00)
Chloride: 105 mmol/L (ref 101–111)
Glucose, Bld: 92 mg/dL (ref 65–99)
Potassium: 4.7 mmol/L (ref 3.5–5.1)
Sodium: 138 mmol/L (ref 135–145)
Total Protein: 8.1 g/dL (ref 6.5–8.1)

## 2015-04-25 LAB — CBC WITH DIFFERENTIAL/PLATELET
Basophils Absolute: 0 10*3/uL (ref 0.0–0.1)
Basophils Relative: 0 %
Eosinophils Absolute: 0.2 10*3/uL (ref 0.0–0.7)
Eosinophils Relative: 2 %
HCT: 45.5 % (ref 36.0–46.0)
HEMOGLOBIN: 15.2 g/dL — AB (ref 12.0–15.0)
LYMPHS ABS: 2 10*3/uL (ref 0.7–4.0)
LYMPHS PCT: 23 %
MCH: 32.6 pg (ref 26.0–34.0)
MCHC: 33.4 g/dL (ref 30.0–36.0)
MCV: 97.6 fL (ref 78.0–100.0)
Monocytes Absolute: 0.3 10*3/uL (ref 0.1–1.0)
Monocytes Relative: 4 %
NEUTROS PCT: 71 %
Neutro Abs: 6.4 10*3/uL (ref 1.7–7.7)
Platelets: 299 10*3/uL (ref 150–400)
RBC: 4.66 MIL/uL (ref 3.87–5.11)
RDW: 13.1 % (ref 11.5–15.5)
WBC: 9 10*3/uL (ref 4.0–10.5)

## 2015-04-25 NOTE — Progress Notes (Signed)
PT states she was not told about a Bowel Prep but is going to office thurs (04/27/15) to talk with Dr.Wilson. I told her to ask Dr.Wilson about Bowel prep instructions

## 2015-04-26 NOTE — Progress Notes (Signed)
BUN faxed to DrWilson

## 2015-04-27 ENCOUNTER — Emergency Department (HOSPITAL_BASED_OUTPATIENT_CLINIC_OR_DEPARTMENT_OTHER): Payer: BLUE CROSS/BLUE SHIELD

## 2015-04-27 ENCOUNTER — Emergency Department (HOSPITAL_BASED_OUTPATIENT_CLINIC_OR_DEPARTMENT_OTHER)
Admission: EM | Admit: 2015-04-27 | Discharge: 2015-04-27 | Disposition: A | Discharge status: Home / Self Care | Payer: BLUE CROSS/BLUE SHIELD | Attending: Emergency Medicine | Admitting: Emergency Medicine

## 2015-04-27 ENCOUNTER — Other Ambulatory Visit: Payer: Self-pay

## 2015-04-27 ENCOUNTER — Encounter (HOSPITAL_BASED_OUTPATIENT_CLINIC_OR_DEPARTMENT_OTHER): Payer: Self-pay

## 2015-04-27 DIAGNOSIS — E669 Obesity, unspecified: Secondary | ICD-10-CM | POA: Diagnosis not present

## 2015-04-27 DIAGNOSIS — Z853 Personal history of malignant neoplasm of breast: Secondary | ICD-10-CM | POA: Diagnosis not present

## 2015-04-27 DIAGNOSIS — R911 Solitary pulmonary nodule: Secondary | ICD-10-CM | POA: Insufficient documentation

## 2015-04-27 DIAGNOSIS — E119 Type 2 diabetes mellitus without complications: Secondary | ICD-10-CM | POA: Diagnosis not present

## 2015-04-27 DIAGNOSIS — K219 Gastro-esophageal reflux disease without esophagitis: Secondary | ICD-10-CM | POA: Insufficient documentation

## 2015-04-27 DIAGNOSIS — Z8739 Personal history of other diseases of the musculoskeletal system and connective tissue: Secondary | ICD-10-CM | POA: Diagnosis not present

## 2015-04-27 DIAGNOSIS — F329 Major depressive disorder, single episode, unspecified: Secondary | ICD-10-CM | POA: Diagnosis not present

## 2015-04-27 DIAGNOSIS — Z872 Personal history of diseases of the skin and subcutaneous tissue: Secondary | ICD-10-CM | POA: Diagnosis not present

## 2015-04-27 DIAGNOSIS — N390 Urinary tract infection, site not specified: Secondary | ICD-10-CM | POA: Diagnosis not present

## 2015-04-27 DIAGNOSIS — R1013 Epigastric pain: Secondary | ICD-10-CM | POA: Diagnosis present

## 2015-04-27 DIAGNOSIS — Z91040 Latex allergy status: Secondary | ICD-10-CM | POA: Diagnosis not present

## 2015-04-27 DIAGNOSIS — Z79899 Other long term (current) drug therapy: Secondary | ICD-10-CM | POA: Diagnosis not present

## 2015-04-27 DIAGNOSIS — K2270 Barrett's esophagus without dysplasia: Secondary | ICD-10-CM | POA: Insufficient documentation

## 2015-04-27 DIAGNOSIS — R918 Other nonspecific abnormal finding of lung field: Secondary | ICD-10-CM

## 2015-04-27 DIAGNOSIS — J45909 Unspecified asthma, uncomplicated: Secondary | ICD-10-CM | POA: Insufficient documentation

## 2015-04-27 DIAGNOSIS — Z87891 Personal history of nicotine dependence: Secondary | ICD-10-CM | POA: Diagnosis not present

## 2015-04-27 DIAGNOSIS — R52 Pain, unspecified: Secondary | ICD-10-CM

## 2015-04-27 LAB — COMPREHENSIVE METABOLIC PANEL
ALK PHOS: 85 U/L (ref 38–126)
ALT: 32 U/L (ref 14–54)
ANION GAP: 11 (ref 5–15)
AST: 30 U/L (ref 15–41)
Albumin: 3.9 g/dL (ref 3.5–5.0)
BILIRUBIN TOTAL: 0.8 mg/dL (ref 0.3–1.2)
BUN: 33 mg/dL — ABNORMAL HIGH (ref 6–20)
CALCIUM: 9.2 mg/dL (ref 8.9–10.3)
CO2: 24 mmol/L (ref 22–32)
CREATININE: 1.06 mg/dL — AB (ref 0.44–1.00)
Chloride: 101 mmol/L (ref 101–111)
GFR, EST NON AFRICAN AMERICAN: 57 mL/min — AB (ref >60)
Glucose, Bld: 139 mg/dL — ABNORMAL HIGH (ref 65–99)
Potassium: 4.1 mmol/L (ref 3.5–5.1)
SODIUM: 136 mmol/L (ref 135–145)
TOTAL PROTEIN: 7.7 g/dL (ref 6.5–8.1)

## 2015-04-27 LAB — CBC WITH DIFFERENTIAL/PLATELET
Basophils Absolute: 0 10*3/uL (ref 0.0–0.1)
Basophils Relative: 0 %
EOS ABS: 0.1 10*3/uL (ref 0.0–0.7)
EOS PCT: 1 %
HCT: 41.9 % (ref 36.0–46.0)
HEMOGLOBIN: 14.3 g/dL (ref 12.0–15.0)
LYMPHS ABS: 1.7 10*3/uL (ref 0.7–4.0)
LYMPHS PCT: 14 %
MCH: 32.6 pg (ref 26.0–34.0)
MCHC: 34.1 g/dL (ref 30.0–36.0)
MCV: 95.7 fL (ref 78.0–100.0)
MONOS PCT: 4 %
Monocytes Absolute: 0.5 10*3/uL (ref 0.1–1.0)
NEUTROS PCT: 81 %
Neutro Abs: 9.7 10*3/uL — ABNORMAL HIGH (ref 1.7–7.7)
Platelets: 294 10*3/uL (ref 150–400)
RBC: 4.38 MIL/uL (ref 3.87–5.11)
RDW: 12.5 % (ref 11.5–15.5)
WBC: 12 10*3/uL — ABNORMAL HIGH (ref 4.0–10.5)

## 2015-04-27 LAB — URINALYSIS, ROUTINE W REFLEX MICROSCOPIC
GLUCOSE, UA: NEGATIVE mg/dL
HGB URINE DIPSTICK: NEGATIVE
KETONES UR: 40 mg/dL — AB
Nitrite: NEGATIVE
PROTEIN: NEGATIVE mg/dL
Specific Gravity, Urine: 1.03 (ref 1.005–1.030)
UROBILINOGEN UA: 1 mg/dL (ref 0.0–1.0)
pH: 5 (ref 5.0–8.0)

## 2015-04-27 LAB — URINE MICROSCOPIC-ADD ON

## 2015-04-27 LAB — TROPONIN I

## 2015-04-27 LAB — LIPASE, BLOOD: LIPASE: 24 U/L (ref 22–51)

## 2015-04-27 MED ORDER — SODIUM CHLORIDE 0.9 % IV BOLUS (SEPSIS)
1000.0000 mL | Freq: Once | INTRAVENOUS | Status: AC
  Administered 2015-04-27: 1000 mL via INTRAVENOUS

## 2015-04-27 MED ORDER — MORPHINE SULFATE (PF) 4 MG/ML IV SOLN
4.0000 mg | Freq: Once | INTRAVENOUS | Status: AC
  Administered 2015-04-27: 4 mg via INTRAVENOUS
  Filled 2015-04-27: qty 1

## 2015-04-27 MED ORDER — NITROFURANTOIN MONOHYD MACRO 100 MG PO CAPS: 100.0000 mg | ORAL_CAPSULE | Freq: Two times a day (BID) | ORAL | Status: DC

## 2015-04-27 MED ORDER — DICYCLOMINE HCL 10 MG/ML IM SOLN
20.0000 mg | Freq: Once | INTRAMUSCULAR | Status: AC
  Administered 2015-04-27: 20 mg via INTRAMUSCULAR
  Filled 2015-04-27: qty 2

## 2015-04-27 MED ORDER — ONDANSETRON HCL 4 MG/2ML IJ SOLN
4.0000 mg | Freq: Once | INTRAMUSCULAR | Status: AC
  Administered 2015-04-27: 4 mg via INTRAVENOUS
  Filled 2015-04-27: qty 2

## 2015-04-27 MED ORDER — ONDANSETRON 8 MG PO TBDP
8.0000 mg | ORAL_TABLET | Freq: Once | ORAL | Status: AC
  Administered 2015-04-27: 8 mg via ORAL
  Filled 2015-04-27: qty 1

## 2015-04-27 MED ORDER — MORPHINE SULFATE (PF) 2 MG/ML IV SOLN
2.0000 mg | Freq: Once | INTRAVENOUS | Status: AC
Start: 2015-04-27 — End: 2015-04-27
  Administered 2015-04-27: 2 mg via INTRAVENOUS
  Filled 2015-04-27: qty 1

## 2015-04-27 MED ORDER — GI COCKTAIL ~~LOC~~
30.0000 mL | Freq: Once | ORAL | Status: AC
  Administered 2015-04-27: 30 mL via ORAL
  Filled 2015-04-27: qty 30

## 2015-04-27 MED ORDER — DICYCLOMINE HCL 20 MG PO TABS: 20.0000 mg | ORAL_TABLET | Freq: Two times a day (BID) | ORAL | Status: DC

## 2015-04-27 MED ORDER — KETOROLAC TROMETHAMINE 30 MG/ML IJ SOLN
30.0000 mg | Freq: Once | INTRAMUSCULAR | Status: DC
  Filled 2015-04-27: qty 1

## 2015-04-27 NOTE — ED Provider Notes (Addendum)
CSN: [id number]     Arrival date & time 04/27/15  0536 History   First MD Initiated Contact with Patient 04/27/15 0544     Chief Complaint  Patient presents with  . Abdominal Pain     (Consider location/radiation/quality/duration/timing/severity/associated sxs/prior Treatment) Patient is a 59 y.o. female presenting with flank pain. The history is provided by the patient.  Flank Pain This is a new problem. The current episode started 3 to 5 hours ago. The problem occurs constantly. The problem has not changed since onset.Associated symptoms include abdominal pain. Pertinent negatives include no chest pain, no headaches and no shortness of breath. Nothing aggravates the symptoms. Nothing relieves the symptoms. She has tried nothing for the symptoms. The treatment provided no relief.  B flank pain with epigastric pain.  Is only doing protein in meat and protein based products in preparation for bariatric surgery    Past Medical History  Diagnosis Date  . Depression   . Other psoriasis     involving hands only  . Obesity   . Glucose intolerance (pre-diabetes)   . Complication of anesthesia     INSOMNIA / NIGHTMARES AFTER HYSTERECTOMY X 6 MONTHS  . Asthma     INFREQUENT PROBLEM - NO INHALER CURRENTLY  . Arthritis   . DDD (degenerative disc disease), lumbar   . Spinal stenosis   . GERD (gastroesophageal reflux disease)   . Barrett's esophagus   . IBS (irritable bowel syndrome)   . Diabetes mellitus without complication   . Sleep - wake disorder   . Cancer     BREAST CANCER - L LUMPECTOMY - RADIATION   Past Surgical History  Procedure Laterality Date  . Pilonidal cyst excision    . Partial hysterectomy      no salpingo-oophorectomy  . Breast surgery  2005    LUMPECTOMY-CANCER  . Bunionectomy      L FOOT  . Wisdom tooth extraction    . Diagnostic laparoscopy      FOR ENDOMETRIOSIS   Family History  Problem Relation Age of Onset  . Adopted: Yes   Social History   Substance Use Topics  . Smoking status: Former Smoker    Quit date: 04/24/2013  . Smokeless tobacco: None  . Alcohol Use: Yes     Comment: NONE SINCE JULY   OB History    No data available     Review of Systems  Respiratory: Negative for shortness of breath.   Cardiovascular: Negative for chest pain.  Gastrointestinal: Positive for abdominal pain. Negative for vomiting, diarrhea and constipation.  Genitourinary: Positive for flank pain.  Neurological: Negative for headaches.  All other systems reviewed and are negative.     Allergies  Peach; Peanut-containing drug products; Adhesive; Keflex; Pollen extract; Shellfish-derived products; Bextra; Diclofenac; Latex; and Nickel  Home Medications   Prior to Admission medications   Medication Sig Start Date End Date Taking? Authorizing Provider  BIOTIN PO Take 1 tablet by mouth daily.   Yes Historical Provider, MD  calcium carbonate (OS-CAL - DOSED IN MG OF ELEMENTAL CALCIUM) 1250 MG tablet Take 1 tablet by mouth daily.     Yes Historical Provider, MD  cholecalciferol (VITAMIN D) 1000 UNITS tablet Take 1,000 Units by mouth daily.     Yes Historical Provider, MD  metFORMIN (GLUCOPHAGE) 500 MG tablet Take 500 mg by mouth daily.    Yes Historical Provider, MD  Multiple Vitamin (MULTIVITAMIN WITH MINERALS) TABS tablet Take 1 tablet by mouth daily.   Yes  Historical Provider, MD  omeprazole (PRILOSEC) 40 MG capsule Take 40 mg by mouth 2 (two) times daily as needed (heartburn).    Yes Historical Provider, MD  venlafaxine XR (EFFEXOR-XR) 37.5 MG 24 hr capsule Take 37.5 mg by mouth daily. 03/08/15  Yes Historical Provider, MD  vitamin B-12 (CYANOCOBALAMIN) 500 MCG tablet Take 500 mcg by mouth daily.   Yes Historical Provider, MD   BP 141/87 mmHg  Pulse 104  Temp(Src) 97.7 F (36.5 C) (Oral)  Resp 22  Ht 5\' 3"  (1.6 m)  Wt 256 lb (116.121 kg)  BMI 45.36 kg/m2  SpO2 99% Physical Exam  Constitutional: She is oriented to person, place, and  time. She appears well-developed and well-nourished. No distress.  HENT:  Head: Normocephalic and atraumatic.  Mouth/Throat: Oropharynx is clear and moist.  Eyes: Conjunctivae are normal. Pupils are equal, round, and reactive to light.  Neck: Normal range of motion. Neck supple.  Cardiovascular: Normal rate, regular rhythm and intact distal pulses.   Pulmonary/Chest: Effort normal and breath sounds normal. No respiratory distress. She has no wheezes. She has no rales.  Abdominal: Soft. She exhibits no distension and no mass. Bowel sounds are increased. There is no tenderness. There is no rigidity, no rebound, no guarding, no tenderness at McBurney's point and negative Murphy's sign.  Gassy throughout  Musculoskeletal: Normal range of motion.  Neurological: She is alert and oriented to person, place, and time.  Skin: Skin is warm and dry.  Psychiatric: She has a normal mood and affect.    ED Course  Procedures (including critical care time) Labs Review Labs Reviewed  CBC WITH DIFFERENTIAL/PLATELET  COMPREHENSIVE METABOLIC PANEL  LIPASE, BLOOD  URINALYSIS, ROUTINE W REFLEX MICROSCOPIC (NOT AT [facility])  TROPONIN I    Imaging Review No results found. I have personally reviewed and evaluated these images and lab results as part of my medical decision-making.   EKG Interpretation None      MDM   Final diagnoses:  None     EKG Interpretation  Date/Time:  Thursday April 27 2015 06:01:57 EDT Ventricular Rate:  91 PR Interval:  154 QRS Duration: 90 QT Interval:  374 QTC Calculation: 460 R Axis:   68 Text Interpretation:  Normal sinus rhythm Confirmed by [facility]  MD, Emmaline Kluver ([id number]) on 04/27/2015 6:01:23 AM       Patient informed of need to follow up with PMD for outpatient Ct of the chest with contrast for pulmonary nodules found incidentally on CT scan.  Follow up in 3 to six months for this test.  This is printed on your discharge paperwork to take to your regular  healthcare provider.  Patient verbalizes understanding and agrees to follow up  Mild urinary tract infection.  No elevation of LFTs, Lipase or CT findings of biliary disease.  Will treat with pain medication and have patient follow up today with Dr. Redmond Pulling of [place] at 1030 as scheduled.      Veatrice Kells, MD 04/27/15 4920  Veatrice Kells, MD 04/27/15 [phone]

## 2015-04-27 NOTE — ED Notes (Signed)
Pt states she thinks she is having a gall bladder attack that started at 0200 today with n/v, c/o mid-sternal, epigastric pain that radiates to her back.

## 2015-04-27 NOTE — Discharge Instructions (Signed)
°Emergency Department Resource Guide °1) Find a Doctor and Pay Out of Pocket °Although you won't have to find out who is covered by your insurance plan, it is a good idea to ask around and get recommendations. You will then need to call the office and see if the doctor you have chosen will accept you as a new patient and what types of options they offer for patients who are self-pay. Some doctors offer discounts or will set up payment plans for their patients who do not have insurance, but you will need to ask so you aren't surprised when you get to your appointment. ° °2) Contact Your Local Health Department °Not all health departments have doctors that can see patients for sick visits, but many do, so it is worth a call to see if yours does. If you don't know where your local health department is, you can check in your phone book. The CDC also has a tool to help you locate your state's health department, and many state websites also have listings of all of their local health departments. ° °3) Find a Walk-in Clinic °If your illness is not likely to be very severe or complicated, you may want to try a walk in clinic. These are popping up all over the country in pharmacies, drugstores, and shopping centers. They're usually staffed by nurse practitioners or physician assistants that have been trained to treat common illnesses and complaints. They're usually fairly quick and inexpensive. However, if you have serious medical issues or chronic medical problems, these are probably not your best option. ° °No Primary Care Doctor: °- Call [facility] at  832-8000 - they can help you locate a primary care doctor that  accepts your insurance, provides certain services, etc. °- Physician Referral Service- 1-800-533-3463 ° °Chronic Pain Problems: °Organization         Address  Phone   Notes  °[facility]  (336) 297-2271 Patients need to be referred by their primary care doctor.  ° °Medication  Assistance: °Organization         Address  Phone   Notes  °[place] [facility] [address] °[place], [place] [address] (336) 641-8030 --Must be a resident of [place] °-- Must have NO insurance coverage whatsoever (no Medicaid/ Medicare, etc.) °-- The pt. MUST have a primary care doctor that directs their care regularly and follows them in the community °  °[facility]  (866) 331-1348   °[employer]  (888) 892-1162   ° °Agencies that provide inexpensive medical care: °Organization         Address  Phone   Notes  °[facility]  (336) 832-8035   °[facility]    (336) 832-7272   °[facility] [address] °[place], [place] [address] (336) 832-4777   °[facility] [address], °[place] (336) 271-4999   °[facility]    (336) 373-0678   °[facility]    (336) 272-1050   °[facility] ° [address], [place] Phone:  (336) 832-4444, Fax:  (336) 832-4440 Hours of Operation:  9 am - 6 pm, M-F.  Also accepts Medicaid/Medicare and self-pay.  °[facility] ° [address], [place] Phone: (336) 832-3150, Fax: (336) 832-3151. Hours of Operation:  8:30 am - 5:30 pm, M-F.  Also accepts Medicaid and self-pay.  °[facility] [place] [address], [place] Phone: (336) 878-6027   °[facility] [address], [place], [place] (336)723-1848, Ext. 123 Mondays & Thursdays: 7-9 AM.  First 15 patients are seen on a first come, first serve basis. °  ° °Medicaid-accepting [place] Providers: ° °Organization         Address  Phone   Notes  °[facility] [address], [place] (336) 641-2100 Also accepts self-pay patients.  °[facility] [address], [place] ° (336) 856-9996   °[facility] [address], [place]  (336) 288-8857   °[facility] [address] [place] [address], [place] (336) 299-7000   °Veita Bland [address], [place]  ° (336) 373-1557 Only accepts [place] Access Medicaid patients after they have their name applied to their card.  ° °Self-Pay (no insurance) in [place]: ° °Organization         Address  Phone   Notes  °Sickle Cell Patients, [facility] [address], [place] (336) 832-1970   °[facility] [address], [place] (336) 832-4400   °[facility] ° [address],  (336) 992-4800   °[facility]/Dr. Osei-Bonsu ° [address] [place] [address], [place] or [address], [place] (336) 841-8500 Phone number for both [place] and [place] locations is the same.  °[facility] [address], [place] (336) 299-0000   °[facility] [address] [place] [address], [place] or [address] (336) 852-7530 °(336) 878-2260   °[facility] [address], [place] (336) 350-1642, phone; (336) 294-5005, fax Sees patients 1st and 3rd Saturday of every month.  Must not qualify for public or private insurance (i.e. Medicaid, Medicare, [place] Health Choice, Veterans' Benefits) • Household income should be no more than 200% of the poverty level •The clinic cannot treat you if you are pregnant or think you are pregnant • Sexually transmitted diseases are not treated at the clinic.  ° ° °Dental Care: °Organization         Address  Phone  Notes  °[place] [facility] [address], [place] (336) 641-6152 Accepts children up to age 21 who are enrolled in Medicaid or [place] Health Choice; pregnant women with a Medicaid card; and children who have applied for Medicaid or [place] Health Choice, but were declined, whose parents can pay a reduced fee at time of service.  °[place]  [facility] [place]  [address], [place] (336) 641-7733 Accepts children up to age 21 who are enrolled in Medicaid or [place] Health Choice; pregnant women with a Medicaid card; and children who have applied for Medicaid or [place] Health Choice, but were declined, whose parents can pay a reduced fee at time of service.  °[facility] ° [address],  (336) 641-4533 Patients are seen by appointment only. Walk-ins are not accepted. [facility] will see patients 18 years of age and older. °Monday - Tuesday (8am-5pm) °Most Wednesdays (8:30-5pm) °$30 per visit, cash only  °[facility] ° [address], [place] (336) 641-4533 Patients are seen by appointment only. Walk-ins are not accepted. [facility] will see patients 18 years of age and older. °One   Wednesday Evening (Monthly: Volunteer Based).  $30 per visit, cash only  °[facility]  (919) 537-3737 for adults; Children under age 4, call Graduate Pediatric Dentistry at (919) [phone]. Children aged 4-14, please call (919) [phone] to request a pediatric application. ° Dental services are provided in all areas of dental care including fillings, crowns and bridges, complete and partial dentures, implants, gum treatment, root canals, and extractions. Preventive care is also provided. Treatment is provided to both adults and children. °Patients are selected via a lottery and there is often a waiting list. °  °[facility] [address], °[place] ° (336) 763-8833 [email or web address] °  °[facility] [address], [place], [place] (336)723-1848, Ext. 123 Second and Fourth Thursday of each month, opens at 6:30 AM; Clinic ends at 9 AM.  Patients are seen on a first-come first-served basis, and a limited number are seen during each clinic.  ° °[facility] ° [address], [place], [place] (336) 723-7904    Eligibility Requirements °You must have lived in [place], [place], or [place] counties for at least the last three months. °  You cannot be eligible for state or federal sponsored healthcare insurance, including Veterans Administration, Medicaid, or Medicare. °  You generally cannot be eligible for healthcare insurance through your employer.  °  How to apply: °Eligibility screenings are held every Tuesday and Wednesday afternoon from 1:00 pm until 4:00 pm. You do not need an appointment for the interview!  °[facility] [address], [place], [place] 336-631-2330   °[facility]  336-342-8273   °[facility]  336-703-3100   °[facility]  336-570-6415   ° °Behavioral Health Resources in the Community: °Intensive Outpatient Programs °Organization         Address  Phone  Notes  °[facility] [address], [place], [place] 336-878-6098   °[facility] [address], [place], [place] 336-832-9800   °[facility] [address], [place], [place] ° 336-882-2125   °[facility] [address],  °[place], [place] 1-800-853-5163 or 336-641-4981   °Substance Abuse Resources °Organization         Address  Phone  Notes  °[facility]  336-882-2125   °[facility]  336-784-9470   °[facility]  336-285-9073   °[facility]  336-845-3988   °Residential & Outpatient Substance Abuse Program  1-800-659-3381   °Psychological Services °Organization         Address  Phone  Notes  ° Health  336- 832-9600   °[facility]  336- 378-7881   °[facility] [address], [place] 1-800-853-5163 or 336-641-4981   ° °Mobile Crisis Teams °Organization         Address  Phone  Notes  °[facility], Mobile Crisis Care Unit  1-877-626-1772   °Assertive °[facility] ° [address]  [place], [place] 336-834-9664   °Sharon DeEsch [address] °[place] 336-554-5454   ° °Self-Help/Support Groups °Organization         Address  Phone             Notes  °[facility] - variety of support groups  336- 373-1402 Call for more information  °Narcotics Anonymous (NA), [facility] [address], °[place]  2 meetings at this location  ° °  Residential Treatment Programs °Organization         Address  Phone  Notes  °[facility] [address],    °[place]  1-866-801-8205   °[facility] ° [address], [place], [place] 704-293-8524   °[facility] [address], [place] 336-845-3988 Admissions: 8am-3pm M-F  °[facility] [address]    °[place], [place] 336-841-1104   °[facility] [address], [place], [place] 336-379-7146   °[facility] [address]  °[place], [place] 336-285-9073   °[facility] - Intensive Outpatient [address], [place], [place] 336-852-3033   °[facility] [address]  °[place], [place] 1-877-615-2722 or 336-784-9470   °[facility] [address] [place], [place] 336-227-7417 Accepts Medicaid  °[facility] [address]  ° [place] 1-800-659-3381 Substance Abuse/Addiction Treatment  ° °[place] Behavioral Health Resources °Organization         Address  Phone  Notes  °[facility]  (888) 581-9988   °Julie Brannon, PhD [address] [place], [place]   (336) 349-5553 or (336) 951-0000   °[facility]   [address] °[place], [place] (336) 349-4454   °[facility] [address], [place], [place] (336) 342-8316 Insurance/Medicaid/sponsorship through [facility]  °[facility] [address]                                    [place], [place] (336) 342-8316 Therapy/tele-psych/case    °[facility] [address]  ° [place], [place] (336) 349-2233    °Dr. Arfeen  (336) 349-4544   °[facility] [place]  [employer] [place] [facility] 1) [address], [place] °2) [address], [place] °3)  [address], [place] (336) 349-3220 °(336) 342-7768 ° °(336) 342-8140   °[place] [facility] (336) 342-1394 or (336) 342-3537 (After Hours)    ° ° °

## 2015-05-01 ENCOUNTER — Encounter (HOSPITAL_COMMUNITY): Payer: Self-pay | Admitting: *Deleted

## 2015-05-01 ENCOUNTER — Inpatient Hospital Stay (HOSPITAL_COMMUNITY): Payer: BLUE CROSS/BLUE SHIELD

## 2015-05-01 ENCOUNTER — Inpatient Hospital Stay (HOSPITAL_COMMUNITY): Payer: BLUE CROSS/BLUE SHIELD | Admitting: Certified Registered Nurse Anesthetist

## 2015-05-01 ENCOUNTER — Inpatient Hospital Stay (HOSPITAL_COMMUNITY)
Admission: RE | Admit: 2015-05-01 | Discharge: 2015-05-03 | DRG: 620 | Disposition: A | Discharge status: Home / Self Care | Payer: BLUE CROSS/BLUE SHIELD | Source: Ambulatory Visit | Attending: General Surgery | Admitting: General Surgery

## 2015-05-01 ENCOUNTER — Encounter (HOSPITAL_COMMUNITY)
Admission: RE | Disposition: A | Discharge status: Home / Self Care | Payer: Self-pay | Source: Ambulatory Visit | Attending: General Surgery

## 2015-05-01 DIAGNOSIS — K2270 Barrett's esophagus without dysplasia: Secondary | ICD-10-CM | POA: Diagnosis present

## 2015-05-01 DIAGNOSIS — K449 Diaphragmatic hernia without obstruction or gangrene: Secondary | ICD-10-CM | POA: Diagnosis present

## 2015-05-01 DIAGNOSIS — K210 Gastro-esophageal reflux disease with esophagitis: Secondary | ICD-10-CM | POA: Diagnosis present

## 2015-05-01 DIAGNOSIS — E119 Type 2 diabetes mellitus without complications: Secondary | ICD-10-CM | POA: Diagnosis present

## 2015-05-01 DIAGNOSIS — Z85118 Personal history of other malignant neoplasm of bronchus and lung: Secondary | ICD-10-CM | POA: Diagnosis not present

## 2015-05-01 DIAGNOSIS — Z90710 Acquired absence of both cervix and uterus: Secondary | ICD-10-CM

## 2015-05-01 DIAGNOSIS — M179 Osteoarthritis of knee, unspecified: Secondary | ICD-10-CM | POA: Diagnosis present

## 2015-05-01 DIAGNOSIS — Z923 Personal history of irradiation: Secondary | ICD-10-CM | POA: Diagnosis not present

## 2015-05-01 DIAGNOSIS — Z87891 Personal history of nicotine dependence: Secondary | ICD-10-CM | POA: Diagnosis not present

## 2015-05-01 DIAGNOSIS — M545 Low back pain, unspecified: Secondary | ICD-10-CM | POA: Diagnosis present

## 2015-05-01 DIAGNOSIS — Z6841 Body Mass Index (BMI) 40.0 and over, adult: Secondary | ICD-10-CM

## 2015-05-01 DIAGNOSIS — Z87442 Personal history of urinary calculi: Secondary | ICD-10-CM | POA: Diagnosis not present

## 2015-05-01 DIAGNOSIS — E7800 Pure hypercholesterolemia, unspecified: Secondary | ICD-10-CM | POA: Diagnosis present

## 2015-05-01 DIAGNOSIS — Z79899 Other long term (current) drug therapy: Secondary | ICD-10-CM

## 2015-05-01 DIAGNOSIS — K8000 Calculus of gallbladder with acute cholecystitis without obstruction: Secondary | ICD-10-CM | POA: Diagnosis present

## 2015-05-01 DIAGNOSIS — K209 Esophagitis, unspecified: Secondary | ICD-10-CM

## 2015-05-01 DIAGNOSIS — Z419 Encounter for procedure for purposes other than remedying health state, unspecified: Secondary | ICD-10-CM

## 2015-05-01 DIAGNOSIS — Z9221 Personal history of antineoplastic chemotherapy: Secondary | ICD-10-CM

## 2015-05-01 DIAGNOSIS — Z01812 Encounter for preprocedural laboratory examination: Secondary | ICD-10-CM | POA: Diagnosis not present

## 2015-05-01 DIAGNOSIS — M1710 Unilateral primary osteoarthritis, unspecified knee: Secondary | ICD-10-CM | POA: Diagnosis present

## 2015-05-01 DIAGNOSIS — E782 Mixed hyperlipidemia: Secondary | ICD-10-CM | POA: Diagnosis present

## 2015-05-01 DIAGNOSIS — Z853 Personal history of malignant neoplasm of breast: Secondary | ICD-10-CM

## 2015-05-01 DIAGNOSIS — Z9884 Bariatric surgery status: Secondary | ICD-10-CM

## 2015-05-01 DIAGNOSIS — E66813 Obesity, class 3: Secondary | ICD-10-CM | POA: Diagnosis present

## 2015-05-01 HISTORY — PX: LAPAROSCOPIC ROUX-EN-Y GASTRIC BYPASS WITH HIATAL HERNIA REPAIR: SHX6513

## 2015-05-01 HISTORY — PX: CHOLECYSTECTOMY: SHX55

## 2015-05-01 LAB — GLUCOSE, CAPILLARY
GLUCOSE-CAPILLARY: 122 mg/dL — AB (ref 65–99)
GLUCOSE-CAPILLARY: 139 mg/dL — AB (ref 65–99)
GLUCOSE-CAPILLARY: 105 mg/dL — AB (ref 65–99)
Glucose-Capillary: 178 mg/dL — ABNORMAL HIGH (ref 65–99)

## 2015-05-01 LAB — HEMOGLOBIN AND HEMATOCRIT, BLOOD
HCT: 39.6 % (ref 36.0–46.0)
Hemoglobin: 13.1 g/dL (ref 12.0–15.0)

## 2015-05-01 SURGERY — CREATION, GASTRIC BYPASS, LAPAROSCOPIC, USING ROUX-EN-Y GASTROENTEROSTOMY, WITH HIATAL HERNIA REPAIR
Anesthesia: General | Site: Abdomen

## 2015-05-01 MED ORDER — PHENYLEPHRINE HCL 10 MG/ML IJ SOLN
INTRAMUSCULAR | Status: DC | PRN
  Administered 2015-05-01: 80 ug via INTRAVENOUS
  Administered 2015-05-01 (×2): 40 ug via INTRAVENOUS
  Administered 2015-05-01: 120 ug via INTRAVENOUS

## 2015-05-01 MED ORDER — MIDAZOLAM HCL 2 MG/2ML IJ SOLN
INTRAMUSCULAR | Status: AC
  Filled 2015-05-01: qty 4

## 2015-05-01 MED ORDER — HYDROMORPHONE HCL 1 MG/ML IJ SOLN
INTRAMUSCULAR | Status: AC
  Filled 2015-05-01: qty 1

## 2015-05-01 MED ORDER — FENTANYL CITRATE (PF) 100 MCG/2ML IJ SOLN
INTRAMUSCULAR | Status: AC
  Filled 2015-05-01: qty 4

## 2015-05-01 MED ORDER — STERILE WATER FOR IRRIGATION IR SOLN
Status: DC | PRN
  Administered 2015-05-01: 1000 mL

## 2015-05-01 MED ORDER — METHOCARBAMOL 1000 MG/10ML IJ SOLN
500.0000 mg | Freq: Three times a day (TID) | INTRAVENOUS | Status: DC
  Administered 2015-05-01 – 2015-05-03 (×5): 500 mg via INTRAVENOUS
  Filled 2015-05-01 (×12): qty 5

## 2015-05-01 MED ORDER — ACETAMINOPHEN 160 MG/5ML PO SOLN
650.0000 mg | ORAL | Status: DC | PRN
  Administered 2015-05-02: 650 mg via ORAL
  Filled 2015-05-01: qty 20.3

## 2015-05-01 MED ORDER — IOHEXOL 300 MG/ML  SOLN
INTRAMUSCULAR | Status: DC | PRN
  Administered 2015-05-01: 4 mL

## 2015-05-01 MED ORDER — HYDRALAZINE HCL 20 MG/ML IJ SOLN
INTRAMUSCULAR | Status: DC | PRN
  Administered 2015-05-01: 2 mg via INTRAVENOUS

## 2015-05-01 MED ORDER — MIDAZOLAM HCL 5 MG/5ML IJ SOLN
INTRAMUSCULAR | Status: DC | PRN
  Administered 2015-05-01: 2 mg via INTRAVENOUS

## 2015-05-01 MED ORDER — PREMIER PROTEIN SHAKE
2.0000 [oz_av] | Freq: Four times a day (QID) | ORAL | Status: DC
  Administered 2015-05-03 (×3): 2 [oz_av] via ORAL
  Filled 2015-05-01: qty 325.31

## 2015-05-01 MED ORDER — ACETAMINOPHEN 160 MG/5ML PO SOLN: 325.0000 mg | ORAL | Status: DC | PRN

## 2015-05-01 MED ORDER — SUCCINYLCHOLINE CHLORIDE 20 MG/ML IJ SOLN
INTRAMUSCULAR | Status: DC | PRN
  Administered 2015-05-01: 40 mg via INTRAVENOUS
  Administered 2015-05-01: 100 mg via INTRAVENOUS

## 2015-05-01 MED ORDER — ROCURONIUM BROMIDE 100 MG/10ML IV SOLN
INTRAVENOUS | Status: DC | PRN
  Administered 2015-05-01: 10 mg via INTRAVENOUS
  Administered 2015-05-01 (×2): 20 mg via INTRAVENOUS
  Administered 2015-05-01: 10 mg via INTRAVENOUS
  Administered 2015-05-01: 40 mg via INTRAVENOUS
  Administered 2015-05-01: 10 mg via INTRAVENOUS

## 2015-05-01 MED ORDER — 0.9 % SODIUM CHLORIDE (POUR BTL) OPTIME
TOPICAL | Status: DC | PRN
  Administered 2015-05-01: 1000 mL

## 2015-05-01 MED ORDER — ALBUMIN HUMAN 5 % IV SOLN
INTRAVENOUS | Status: AC
  Filled 2015-05-01: qty 250

## 2015-05-01 MED ORDER — NEOSTIGMINE METHYLSULFATE 10 MG/10ML IV SOLN
INTRAVENOUS | Status: DC | PRN
  Administered 2015-05-01: 5 mg via INTRAVENOUS

## 2015-05-01 MED ORDER — ROCURONIUM BROMIDE 100 MG/10ML IV SOLN
INTRAVENOUS | Status: AC
  Filled 2015-05-01: qty 1

## 2015-05-01 MED ORDER — PROPOFOL 10 MG/ML IV BOLUS
INTRAVENOUS | Status: AC
  Filled 2015-05-01: qty 20

## 2015-05-01 MED ORDER — PROMETHAZINE HCL 25 MG/ML IJ SOLN: 12.5000 mg | Freq: Four times a day (QID) | INTRAMUSCULAR | Status: DC | PRN

## 2015-05-01 MED ORDER — NEOSTIGMINE METHYLSULFATE 10 MG/10ML IV SOLN
INTRAVENOUS | Status: AC
  Filled 2015-05-01: qty 1

## 2015-05-01 MED ORDER — EVICEL 5 ML EX KIT
PACK | Freq: Once | CUTANEOUS | Status: AC
  Administered 2015-05-01: 1
  Filled 2015-05-01: qty 1

## 2015-05-01 MED ORDER — PANTOPRAZOLE SODIUM 40 MG IV SOLR
40.0000 mg | Freq: Every day | INTRAVENOUS | Status: DC
  Administered 2015-05-01 – 2015-05-02 (×2): 40 mg via INTRAVENOUS
  Filled 2015-05-01 (×3): qty 40

## 2015-05-01 MED ORDER — ONDANSETRON HCL 4 MG/2ML IJ SOLN
INTRAMUSCULAR | Status: AC
  Filled 2015-05-01: qty 2

## 2015-05-01 MED ORDER — SODIUM CHLORIDE 0.9 % IJ SOLN
INTRAMUSCULAR | Status: DC | PRN
  Administered 2015-05-01: 50 mL

## 2015-05-01 MED ORDER — INSULIN ASPART 100 UNIT/ML ~~LOC~~ SOLN
0.0000 [IU] | SUBCUTANEOUS | Status: DC
  Administered 2015-05-01: 3 [IU] via SUBCUTANEOUS

## 2015-05-01 MED ORDER — HYDROMORPHONE HCL 1 MG/ML IJ SOLN
0.2500 mg | INTRAMUSCULAR | Status: DC | PRN
  Administered 2015-05-01 (×4): 0.5 mg via INTRAVENOUS

## 2015-05-01 MED ORDER — HEPARIN SODIUM (PORCINE) 5000 UNIT/ML IJ SOLN
5000.0000 [IU] | INTRAMUSCULAR | Status: AC
  Administered 2015-05-01: 5000 [IU] via SUBCUTANEOUS
  Filled 2015-05-01: qty 1

## 2015-05-01 MED ORDER — ALBUMIN HUMAN 5 % IV SOLN
INTRAVENOUS | Status: DC | PRN
  Administered 2015-05-01: 12:00:00 via INTRAVENOUS

## 2015-05-01 MED ORDER — CHLORHEXIDINE GLUCONATE 4 % EX LIQD: 60.0000 mL | Freq: Once | CUTANEOUS | Status: DC

## 2015-05-01 MED ORDER — ONDANSETRON HCL 4 MG/2ML IJ SOLN
INTRAMUSCULAR | Status: DC | PRN
  Administered 2015-05-01: 4 mg via INTRAVENOUS

## 2015-05-01 MED ORDER — ONDANSETRON HCL 4 MG/2ML IJ SOLN
4.0000 mg | INTRAMUSCULAR | Status: DC | PRN
  Administered 2015-05-01 (×2): 4 mg via INTRAVENOUS
  Filled 2015-05-01 (×2): qty 2

## 2015-05-01 MED ORDER — ENOXAPARIN SODIUM 30 MG/0.3ML ~~LOC~~ SOLN
30.0000 mg | Freq: Two times a day (BID) | SUBCUTANEOUS | Status: DC
  Administered 2015-05-02 – 2015-05-03 (×3): 30 mg via SUBCUTANEOUS
  Filled 2015-05-01 (×5): qty 0.3

## 2015-05-01 MED ORDER — HYDRALAZINE HCL 20 MG/ML IJ SOLN
INTRAMUSCULAR | Status: AC
  Filled 2015-05-01: qty 1

## 2015-05-01 MED ORDER — METOCLOPRAMIDE HCL 5 MG/ML IJ SOLN
INTRAMUSCULAR | Status: AC
  Filled 2015-05-01: qty 2

## 2015-05-01 MED ORDER — SODIUM CHLORIDE 0.9 % IJ SOLN
INTRAMUSCULAR | Status: AC
Start: 2015-05-01 — End: 2015-05-01
  Filled 2015-05-01: qty 10

## 2015-05-01 MED ORDER — PROPOFOL 10 MG/ML IV BOLUS
INTRAVENOUS | Status: DC | PRN
  Administered 2015-05-01: 200 mg via INTRAVENOUS
  Administered 2015-05-01: 50 mg via INTRAVENOUS

## 2015-05-01 MED ORDER — BUPIVACAINE LIPOSOME 1.3 % IJ SUSP
20.0000 mL | Freq: Once | INTRAMUSCULAR | Status: AC
  Administered 2015-05-01: 20 mL
  Filled 2015-05-01: qty 20

## 2015-05-01 MED ORDER — SODIUM CHLORIDE 0.9 % IJ SOLN
INTRAMUSCULAR | Status: AC
  Filled 2015-05-01: qty 50

## 2015-05-01 MED ORDER — PROMETHAZINE HCL 25 MG/ML IJ SOLN: 6.2500 mg | INTRAMUSCULAR | Status: DC | PRN

## 2015-05-01 MED ORDER — MORPHINE SULFATE (PF) 2 MG/ML IV SOLN
2.0000 mg | INTRAVENOUS | Status: DC | PRN
  Administered 2015-05-01 – 2015-05-02 (×3): 2 mg via INTRAVENOUS
  Filled 2015-05-01 (×3): qty 1

## 2015-05-01 MED ORDER — METOCLOPRAMIDE HCL 5 MG/ML IJ SOLN
INTRAMUSCULAR | Status: DC | PRN
  Administered 2015-05-01: 10 mg via INTRAVENOUS

## 2015-05-01 MED ORDER — LACTATED RINGERS IV SOLN
INTRAVENOUS | Status: DC | PRN
  Administered 2015-05-01 (×3): via INTRAVENOUS

## 2015-05-01 MED ORDER — FENTANYL CITRATE (PF) 100 MCG/2ML IJ SOLN
INTRAMUSCULAR | Status: DC | PRN
  Administered 2015-05-01: 75 ug via INTRAVENOUS
  Administered 2015-05-01: 50 ug via INTRAVENOUS
  Administered 2015-05-01: 25 ug via INTRAVENOUS
  Administered 2015-05-01 (×6): 50 ug via INTRAVENOUS

## 2015-05-01 MED ORDER — POTASSIUM CHLORIDE IN NACL 20-0.45 MEQ/L-% IV SOLN
INTRAVENOUS | Status: DC
  Administered 2015-05-01: 1000 mL via INTRAVENOUS
  Administered 2015-05-01 – 2015-05-03 (×2): via INTRAVENOUS
  Filled 2015-05-01 (×9): qty 1000

## 2015-05-01 MED ORDER — LACTATED RINGERS IR SOLN
Status: DC | PRN
  Administered 2015-05-01: 1000 mL

## 2015-05-01 MED ORDER — GLYCOPYRROLATE 0.2 MG/ML IJ SOLN
INTRAMUSCULAR | Status: DC | PRN
  Administered 2015-05-01: .5 mg via INTRAVENOUS

## 2015-05-01 MED ORDER — GLYCOPYRROLATE 0.2 MG/ML IJ SOLN
INTRAMUSCULAR | Status: AC
  Filled 2015-05-01: qty 3

## 2015-05-01 MED ORDER — ACETAMINOPHEN 10 MG/ML IV SOLN
1000.0000 mg | Freq: Four times a day (QID) | INTRAVENOUS | Status: AC
  Administered 2015-05-01 – 2015-05-02 (×4): 1000 mg via INTRAVENOUS
  Filled 2015-05-01 (×4): qty 100

## 2015-05-01 MED ORDER — LEVOFLOXACIN IN D5W 750 MG/150ML IV SOLN
750.0000 mg | INTRAVENOUS | Status: AC
  Administered 2015-05-01: 750 mg via INTRAVENOUS
  Filled 2015-05-01: qty 150

## 2015-05-01 MED ORDER — PHENYLEPHRINE 40 MCG/ML (10ML) SYRINGE FOR IV PUSH (FOR BLOOD PRESSURE SUPPORT)
PREFILLED_SYRINGE | INTRAVENOUS | Status: AC
  Filled 2015-05-01: qty 10

## 2015-05-01 MED ORDER — OXYCODONE HCL 5 MG/5ML PO SOLN
5.0000 mg | ORAL | Status: DC | PRN
  Administered 2015-05-02: 5 mg via ORAL
  Administered 2015-05-03: 10 mg via ORAL
  Filled 2015-05-01: qty 5
  Filled 2015-05-01: qty 10

## 2015-05-01 MED ORDER — FENTANYL CITRATE (PF) 250 MCG/5ML IJ SOLN
INTRAMUSCULAR | Status: AC
  Filled 2015-05-01: qty 5

## 2015-05-01 SURGICAL SUPPLY — 82 items
ADH SKN CLS APL DERMABOND .7 (GAUZE/BANDAGES/DRESSINGS) ×1
APL SRG 32X5 SNPLK LF DISP (MISCELLANEOUS)
APPLICATOR COTTON TIP 6IN STRL (MISCELLANEOUS) ×4 IMPLANT
BAG SPEC RTRVL 10 TROC 200 (ENDOMECHANICALS) ×1
BLADE SURG SZ11 CARB STEEL (BLADE) ×2 IMPLANT
CABLE HIGH FREQUENCY MONO STRZ (ELECTRODE) IMPLANT
CHLORAPREP W/TINT 26ML (MISCELLANEOUS) ×4 IMPLANT
CLIP SUT LAPRA TY ABSORB (SUTURE) ×4 IMPLANT
COVER SURGICAL LIGHT HANDLE (MISCELLANEOUS) ×2 IMPLANT
CUTTER FLEX LINEAR 45M (STAPLE) IMPLANT
DERMABOND ADVANCED (GAUZE/BANDAGES/DRESSINGS) ×1
DERMABOND ADVANCED .7 DNX12 (GAUZE/BANDAGES/DRESSINGS) IMPLANT
DEVICE SUT QUICK LOAD TK 5 (STAPLE) IMPLANT
DEVICE SUT TI-KNOT TK 5X26 (MISCELLANEOUS) IMPLANT
DEVICE SUTURE ENDOST 10MM (ENDOMECHANICALS) ×2 IMPLANT
DRAIN PENROSE 18X1/4 LTX STRL (WOUND CARE) ×2 IMPLANT
DRAPE CAMERA CLOSED 9X96 (DRAPES) ×2 IMPLANT
ELECT REM PT RETURN 9FT ADLT (ELECTROSURGICAL) ×2
ELECTRODE REM PT RTRN 9FT ADLT (ELECTROSURGICAL) ×1 IMPLANT
GAUZE SPONGE 4X4 12PLY STRL (GAUZE/BANDAGES/DRESSINGS) IMPLANT
GAUZE SPONGE 4X4 16PLY XRAY LF (GAUZE/BANDAGES/DRESSINGS) ×2 IMPLANT
GLOVE BIOGEL M STRL SZ7.5 (GLOVE) IMPLANT
GOWN STRL REUS W/TWL XL LVL3 (GOWN DISPOSABLE) ×8 IMPLANT
HOVERMATT SINGLE USE (MISCELLANEOUS) ×2 IMPLANT
KIT BASIN OR (CUSTOM PROCEDURE TRAY) ×2 IMPLANT
KIT GASTRIC LAVAGE 34FR ADT (SET/KITS/TRAYS/PACK) ×2 IMPLANT
LUBRICANT JELLY K Y 4OZ (MISCELLANEOUS) ×2 IMPLANT
NDL SPNL 22GX3.5 QUINCKE BK (NEEDLE) ×1 IMPLANT
NEEDLE SPNL 22GX3.5 QUINCKE BK (NEEDLE) ×2 IMPLANT
PACK CARDIOVASCULAR III (CUSTOM PROCEDURE TRAY) ×3 IMPLANT
PEN SKIN MARKING BROAD (MISCELLANEOUS) ×2 IMPLANT
POUCH RETRIEVAL ECOSAC 10 (ENDOMECHANICALS) IMPLANT
POUCH RETRIEVAL ECOSAC 10MM (ENDOMECHANICALS) ×1
RELOAD 45 VASCULAR/THIN (ENDOMECHANICALS) IMPLANT
RELOAD BLUE (STAPLE) ×1 IMPLANT
RELOAD ENDO STITCH 2.0 (ENDOMECHANICALS) ×40
RELOAD STAPLE 45 2.5 WHT GRN (ENDOMECHANICALS) IMPLANT
RELOAD STAPLE 45 3.5 BLU ETS (ENDOMECHANICALS) IMPLANT
RELOAD STAPLE 60 2.6 WHT THN (STAPLE) IMPLANT
RELOAD STAPLE 60 3.6 BLU REG (STAPLE) ×2 IMPLANT
RELOAD STAPLE 60 3.8 GOLD REG (STAPLE) ×1 IMPLANT
RELOAD STAPLE TA45 3.5 REG BLU (ENDOMECHANICALS) ×2 IMPLANT
RELOAD STAPLER BLUE 60MM (STAPLE) ×2 IMPLANT
RELOAD STAPLER GOLD 60MM (STAPLE) ×1 IMPLANT
RELOAD STAPLER WHITE 60MM (STAPLE) ×1 IMPLANT
RELOAD SUT SNGL STCH ABSRB 2-0 (ENDOMECHANICALS) ×4 IMPLANT
RELOAD SUT SNGL STCH BLK 2-0 (ENDOMECHANICALS) ×4 IMPLANT
SCISSORS LAP 5X45 EPIX DISP (ENDOMECHANICALS) ×2 IMPLANT
SEALANT SURGICAL APPL DUAL CAN (MISCELLANEOUS) ×1 IMPLANT
SET IRRIG TUBING LAPAROSCOPIC (IRRIGATION / IRRIGATOR) ×3 IMPLANT
SHEARS HARMONIC ACE PLUS 45CM (MISCELLANEOUS) ×2 IMPLANT
SLEEVE ADV FIXATION 12X100MM (TROCAR) ×4 IMPLANT
SLEEVE XCEL OPT CAN 5 100 (ENDOMECHANICALS) IMPLANT
SOLUTION ANTI FOG 6CC (MISCELLANEOUS) ×2 IMPLANT
STAPLER ECHELON BIOABSB 60 FLE (MISCELLANEOUS) IMPLANT
STAPLER ECHELON LONG 60 440 (INSTRUMENTS) ×2 IMPLANT
STAPLER RELOAD BLUE 60MM (STAPLE) ×4
STAPLER RELOAD GOLD 60MM (STAPLE) ×2
STAPLER RELOAD WHITE 60MM (STAPLE) ×2
SUT MNCRL AB 4-0 PS2 18 (SUTURE) ×3 IMPLANT
SUT RELOAD ENDO STITCH 2 48X1 (ENDOMECHANICALS) ×11
SUT RELOAD ENDO STITCH 2.0 (ENDOMECHANICALS) ×9
SUT SURGIDAC NAB ES-9 0 48 120 (SUTURE) IMPLANT
SUT VIC AB 2-0 SH 27 (SUTURE) ×2
SUT VIC AB 2-0 SH 27X BRD (SUTURE) ×1 IMPLANT
SUTURE RELOAD END STTCH 2 48X1 (ENDOMECHANICALS) ×11 IMPLANT
SUTURE RELOAD ENDO STITCH 2.0 (ENDOMECHANICALS) ×9 IMPLANT
SYR 10ML ECCENTRIC (SYRINGE) ×2 IMPLANT
SYR 20CC LL (SYRINGE) ×4 IMPLANT
SYRINGE 20CC LL (MISCELLANEOUS) ×2 IMPLANT
TOWEL OR 17X26 10 PK STRL BLUE (TOWEL DISPOSABLE) ×2 IMPLANT
TOWEL OR NON WOVEN STRL DISP B (DISPOSABLE) ×2 IMPLANT
TRAY FOLEY W/METER SILVER 14FR (SET/KITS/TRAYS/PACK) ×2 IMPLANT
TRAY FOLEY W/METER SILVER 16FR (SET/KITS/TRAYS/PACK) ×2 IMPLANT
TROCAR ADV FIXATION 12X100MM (TROCAR) ×3 IMPLANT
TROCAR ADV FIXATION 5X100MM (TROCAR) ×2 IMPLANT
TROCAR BALLN 12MMX100 BLUNT (TROCAR) ×3 IMPLANT
TROCAR BLADELESS OPT 5 100 (ENDOMECHANICALS) ×2 IMPLANT
TROCAR XCEL 12X100 BLDLESS (ENDOMECHANICALS) ×2 IMPLANT
TUBING CONNECTING 10 (TUBING) IMPLANT
TUBING ENDO SMARTCAP PENTAX (MISCELLANEOUS) ×2 IMPLANT
TUBING FILTER THERMOFLATOR (ELECTROSURGICAL) ×2 IMPLANT

## 2015-05-01 NOTE — Transfer of Care (Signed)
Immediate Anesthesia Transfer of Care Note  Patient: Lindsey Keller  Procedure(s) Performed: Procedure(s): LAPAROSCOPIC ROUX-EN-Y GASTRIC BYPASS WITH HIATAL HERNIA REPAIR-POSSIBLE CHOLECYSTECTOMY (N/A) LAPAROSCOPIC CHOLECYSTECTOMY WITH INTRAOPERATIVE CHOLANGIOGRAM (N/A)  Patient Location: PACU  Anesthesia Type:General  Level of Consciousness: Patient easily awoken, sedated, comfortable, cooperative, following commands, responds to stimulation.   Airway & Oxygen Therapy: Patient spontaneously breathing, ventilating well, oxygen via simple oxygen mask.  Post-op Assessment: Report given to PACU RN, vital signs reviewed and stable, moving all extremities.   Post vital signs: Reviewed and stable.  Complications: No apparent anesthesia complications

## 2015-05-01 NOTE — Plan of Care (Signed)
Problem: Phase I Progression Outcomes Goal: Initial discharge plan identified Outcome: Completed/Met Date Met:  05/01/15 Pt plans to go home with the assistance of her husband.

## 2015-05-01 NOTE — Op Note (Addendum)
Name:  Lindsey Keller MRN: [id number] Date of Surgery: 05/01/2015  Preop Diagnosis:  Morbid Obesity, S/P RYGB  Postop Diagnosis:  Morbid Obesity (weight - 259, BMI - 46), S/P RYGB, distal esophagitis  Procedure:  Upper endoscopy  (Intraoperative)  Surgeon:  Alphonsa Overall, M.D.  Anesthesia:  GET  Indications for procedure: [name] is a 59 y.o. female whose primary care physician is [name], MD and has completed a Roux-en-Y gastric bypass today by Dr. Redmond Pulling.  I am doing an intraoperative upper endoscopy to evaluate the gastric pouch and the gastro-jejunal anastomosis.  Operative Note: The patient is under general anesthesia.  Dr. Redmond Pulling is laparoscoping the patient while I do an upper endoscopy to evaluate the stomach pouch and gastrojejunal anastomosis.  With the patient intubated, I passed the Pentax endoscope without difficulty down the esophagus.  The esophago-gastric junction was at 35 cm.  The gastro-jejunal anastomosis was at 40 cm.  She had an approximate 2 cm area at the distal esophagus that involved about 25% of the lumen of the esophagus that appeared to be superficial esophagitis.  Dr. Redmond Pulling said that this had been biopsied as Barrett's esophagitis.  (There is no path in Epic for those results)  I did not do a biopsy of the esophagitis today.  The mucosa of the stomach looked viable and the staple line was intact without bleeding.  The gastro-jejunal anastomosis looked okay.  While I insufflated the stomach pouch with air, Dr. Redmond Pulling clamped off the efferent limb of the jejunum.  He then flooded the upper abdomen with saline to put the gastric pouch and gastro-jejunal anastomosis under saline.  There was no bubbling or evidence of a leak.    The scope was then withdrawn.  The esophagus was unremarkable and the patient tolerated the endoscopy without difficulty.  Alphonsa Overall, MD, [facility] Surgery Pager: [phone] Office phone:   [phone]

## 2015-05-01 NOTE — Anesthesia Preprocedure Evaluation (Addendum)
Anesthesia Evaluation  Patient identified by MRN, date of birth, ID band Patient awake    Reviewed: Allergy & Precautions, NPO status   History of Anesthesia Complications (+) PONV  Airway Mallampati: II  TM Distance: >3 FB Neck ROM: Full    Dental   Pulmonary asthma , former smoker,    breath sounds clear to auscultation       Cardiovascular negative cardio ROS   Rhythm:Regular Rate:Normal     Neuro/Psych    GI/Hepatic Neg liver ROS, GERD  ,  Endo/Other  diabetes  Renal/GU negative Renal ROS     Musculoskeletal   Abdominal   Peds  Hematology   Anesthesia Other Findings   Reproductive/Obstetrics                            Anesthesia Physical Anesthesia Plan  ASA: III  Anesthesia Plan: General   Post-op Pain Management:    Induction: Intravenous  Airway Management Planned: Oral ETT  Additional Equipment:   Intra-op Plan:   Post-operative Plan: Possible Post-op intubation/ventilation  Informed Consent: I have reviewed the patients History and Physical, chart, labs and discussed the procedure including the risks, benefits and alternatives for the proposed anesthesia with the patient or authorized representative who has indicated his/her understanding and acceptance.   Dental advisory given  Plan Discussed with: CRNA and Anesthesiologist  Anesthesia Plan Comments:         Anesthesia Quick Evaluation

## 2015-05-01 NOTE — H&P (Signed)
Lindsey Keller 04/27/2015 10:40 AM Location: [facility] Patient #: [id number] DOB: 01-Mar-1956 Married / Language: Cleophus Molt / Race: White Female History of Present Illness Randall Hiss M. Candace Begue MD; 05/01/2015 7:42 AM) The patient is a 59 year old female who presents for a preoperative evaluation. She comes in today for her preop appointment. She has been approved for laparoscopic roux en y gastric bypass and cholecystectomy on 10/3. I initially met her on 6/23 and her weight at that time was 258 lbs. She denies any significant medical changes except for an episode of epigastric pain that was very severe that prompted her to go to the ED earlier this am. She described it as like a rubber band across her upper abdomen under her ribs and somebody tightening it. She had dry heaves and nausea. She had no fever/chills, change in bowel habits. This was her first episode. She had a CT scan which was normal and labs which were normal except for a very mildly elevated creatinine of 1. Her troponin and ekg were normal. On her preop evaluation, she was found to have gallstones and a fatty liver on her abdominal u/s. She denies any jaundice, unplanned weight loss, diarrhea, constipation, dysuria.  Her preop ugi showed a small hiatal hernia. Her bariatric evaluation labs showed a A1C of 7, Total cholesterol 220, TG 179, LDL 110. She is back on metformin bc of the elevated A1C. She is now on a low dose of Effexor. She did switch PCP and her PCP is Dr [name]. Problem List/Past Medical Randall Hiss Ronnie Derby, MD; 05/01/2015 7:42 AM) Candise Bowens ESOPHAGUS DETERMINED BY BIOPSY (K22.70) SYMPTOMATIC CHOLELITHIASIS (K80.20) MULTIPLE PULMONARY NODULES DETERMINED BY COMPUTED TOMOGRAPHY OF LUNG (R91.8) OBESITY, MORBID, BMI 40.0-49.9 (E66.01)  Other Problems Gayland Curry, MD; 05/01/2015 7:42 AM) BACK PAIN, LUMBOSACRAL (M54.5, M54.89) TYPE 2 DIABETES MELLITUS WITHOUT COMPLICATION, WITHOUT LONG-TERM  CURRENT USE OF INSULIN (E11.9) ARTHRITIS OF KNEE (M19.90) GASTROESOPHAGEAL REFLUX DISEASE, ESOPHAGITIS PRESENCE NOT SPECIFIED (K21.9) Depression Breast Cancer  Past Surgical History Gayland Curry, MD; 05/01/2015 7:42 AM) Breast Biopsy Left. Foot Surgery Right. Breast Mass; Local Excision Left. Oral Surgery Hysterectomy (not due to cancer) - Partial  Diagnostic Studies History Gayland Curry, MD; 05/01/2015 7:42 AM) Pap Smear >5 years ago Mammogram within last year Colonoscopy 1-5 years ago  Allergies Elbert Ewings, CMA; 04/27/2015 10:41 AM) Diclofenac *ANALGESICS - ANTI-INFLAMMATORY* No Known Drug Allergies 04/27/2015 (Marked as Inactive) Bextra *ANALGESICS - ANTI-INFLAMMATORY*  Medication History Gayland Curry, MD; 05/01/2015 7:42 AM) Effexor XR (150MG Capsule ER 24HR, Oral) Active. Omeprazole (40MG Capsule DR, Oral) Active. Advil (200MG Tablet, Oral as needed) Active. Medications Reconciled OxyCODONE HCl (5MG/5ML Solution, 5-10 Milliliter Oral every four hours, as needed, Taken starting 04/27/2015) Active. Zofran ODT (4MG Tablet Disperse, 1 (one) Tablet Disperse Oral every six hours, as needed, Taken starting 04/27/2015) Active.  Social History Gayland Curry, MD; 05/01/2015 7:42 AM) Tobacco use Former smoker. No drug use Caffeine use Coffee, Tea. Alcohol use Moderate alcohol use.  Family History Gayland Curry, MD; 05/01/2015 7:42 AM) First Degree Relatives Family history unknown  Pregnancy / Birth History Gayland Curry, MD; 05/01/2015 7:42 AM) Durenda Age 1 Contraceptive History Oral contraceptives. Para 0 Irregular periods Age of menopause 64-55 Age at menarche 29 years.     Review of Systems Gayland Curry, MD; 05/01/2015 7:12 00) General Present- Fatigue and Weight Gain. Not Present- Appetite Loss, Chills, Fever, Night Sweats and Weight Loss. Skin Not Present- Change in Wart/Mole, Dryness, Hives,  Jaundice, New Lesions, Non-Healing  Wounds, Rash and Ulcer. HEENT Present- Seasonal Allergies and Wears glasses/contact lenses. Not Present- Earache, Hearing Loss, Hoarseness, Nose Bleed, Oral Ulcers, Ringing in the Ears, Sinus Pain, Sore Throat, Visual Disturbances and Yellow Eyes. Respiratory Present- Snoring. Not Present- Bloody sputum, Chronic Cough, Difficulty Breathing and Wheezing. Breast Not Present- Breast Mass, Breast Pain, Nipple Discharge and Skin Changes. Cardiovascular Present- Swelling of Extremities. Not Present- Chest Pain, Difficulty Breathing Lying Down, Leg Cramps, Palpitations, Rapid Heart Rate and Shortness of Breath. Gastrointestinal Not Present- Abdominal Pain, Bloating, Bloody Stool, Change in Bowel Habits, Chronic diarrhea, Constipation, Difficulty Swallowing, Excessive gas, Gets full quickly at meals, Hemorrhoids, Indigestion, Nausea, Rectal Pain and Vomiting. Female Genitourinary Not Present- Frequency, Nocturia, Painful Urination, Pelvic Pain and Urgency. Musculoskeletal Present- Back Pain, Joint Pain, Joint Stiffness, Muscle Weakness and Swelling of Extremities. Not Present- Muscle Pain. Neurological Not Present- Decreased Memory, Fainting, Headaches, Numbness, Seizures, Tingling, Tremor, Trouble walking and Weakness. Psychiatric Present- Change in Sleep Pattern and Depression. Not Present- Anxiety, Bipolar, Fearful and Frequent crying. Endocrine Present- Heat Intolerance. Not Present- Cold Intolerance, Excessive Hunger, Hair Changes, Hot flashes and New Diabetes. Hematology Not Present- Easy Bruising, Excessive bleeding, Gland problems, HIV and Persistent Infections.  Vitals Elbert Ewings CMA; 04/27/2015 10:42 AM) 04/27/2015 10:42 AM Weight: 259.8 lb Height: 63in Body Surface Area: 2.29 m Body Mass Index: 46.02 kg/m Temp.: 45F(Temporal)  Pulse: 99 (Regular)  BP: 128/78 (Sitting, Left Arm, Standard)     Physical Exam Randall Hiss M. Casandra Dallaire MD; 05/01/2015 7:11 AM)  General Mental  Status-Alert. General Appearance-Consistent with stated age. Hydration-Well hydrated. Voice-Normal. Note: morbid obesity   Head and Neck Head-normocephalic, atraumatic with no lesions or palpable masses. Trachea-midline. Thyroid Gland Characteristics - normal size and consistency.  Eye Eyeball - Bilateral-Extraocular movements intact. Sclera/Conjunctiva - Bilateral-No scleral icterus.  Chest and Lung Exam Chest and lung exam reveals -quiet, even and easy respiratory effort with no use of accessory muscles and on auscultation, normal breath sounds, no adventitious sounds and normal vocal resonance. Inspection Chest Wall - Normal. Back - normal.  Breast - Did not examine.  Cardiovascular Cardiovascular examination reveals -normal heart sounds, regular rate and rhythm with no murmurs and normal pedal pulses bilaterally.  Abdomen Inspection Inspection of the abdomen reveals - No Hernias. Skin - Scar - Note: old transverse incision. Palpation/Percussion Palpation and Percussion of the abdomen reveal - Soft, Non Tender, No Rebound tenderness, No Rigidity (guarding) and No hepatosplenomegaly. Auscultation Auscultation of the abdomen reveals - Bowel sounds normal.  Peripheral Vascular Upper Extremity Palpation - Pulses bilaterally normal.  Neurologic Neurologic evaluation reveals -alert and oriented x 3 with no impairment of recent or remote memory. Mental Status-Normal.  Neuropsychiatric The patient's mood and affect are described as -normal. Judgment and Insight-insight is appropriate concerning matters relevant to self.  Musculoskeletal Normal Exam - Left-Upper Extremity Strength Normal and Lower Extremity Strength Normal. Normal Exam - Right-Upper Extremity Strength Normal and Lower Extremity Strength Normal. Note: L knee crepitus   Lymphatic Head & Neck  General Head & Neck Lymphatics: Bilateral - Description - Normal. Axillary -  Did not examine. Femoral & Inguinal - Did not examine.    Assessment & Plan Randall Hiss M. Oluwadarasimi Redmon MD; 05/01/2015 7:42 AM)  OBESITY, MORBID, BMI 40.0-49.9 (E66.01) Impression: We discussed her preoperative work up. Because of her Barrett's esophagus, she elected to go with a laparoscopic roux en y gastric bypass. We reviewed her labs and UGI. We discussed her ED visit. Her symptoms suggest biliary  colic/symptomatic cholelithiasis. She would like to proceed with cholecystectomy during her gastric bypass. I don't her small hiatal hernia accounted for her symptoms. With the epigastric pain radiating to her back with nausea/dry heaves, I agree this seems more like gallbladder. I advised her we would do the gastric bypass surgery first, if no issues then we would finished with cholecystectomy. She was given her postoperative pain/nausea prescriptions and her preoperative bowel prep instructions. all of her questions were asked and answered.  Current Plans Pt Education - EMW_preopbariatric BARRETT'S ESOPHAGUS DETERMINED BY BIOPSY (K22.70)  ARTHRITIS OF KNEE (M19.90)  BACK PAIN, LUMBOSACRAL (M54.5)  TYPE 2 DIABETES MELLITUS WITHOUT COMPLICATION, WITHOUT LONG-TERM CURRENT USE OF INSULIN (E11.9)  GASTROESOPHAGEAL REFLUX DISEASE, ESOPHAGITIS PRESENCE NOT SPECIFIED (K21.9)  MULTIPLE PULMONARY NODULES DETERMINED BY COMPUTED TOMOGRAPHY OF LUNG (R91.8) Impression: We discussed the finding of bilateral small pulmonary nodules in her lungs. She has a very remote history of breast cancer in 2005. She is a former smoker. I advised her that she will need a follow up CT chest to evaluate these nodules and monitor them to make sure they are not growing. She voiced understanding.  SYMPTOMATIC CHOLELITHIASIS (K80.20) Impression: I believe the patient's symptoms are consistent with gallbladder disease.  I discussed laparoscopic cholecystectomy with IOC in detail. The patient was given educational material as well as  diagrams detailing the procedure. We discussed the risks and benefits of a laparoscopic cholecystectomy including, but not limited to bleeding, infection, injury to surrounding structures such as the intestine or liver, bile leak, retained gallstones, need to convert to an open procedure, prolonged diarrhea, blood clots such as DVT, common bile duct injury, anesthesia risks, and possible need for additional procedures. We discussed the typical post-operative recovery course. I explained that the likelihood of improvement of their symptoms is good.  We did discuss that if she had a postoperative complication from her gallbladder surgery (bile leak) it would increase her gastric bypass risk rate. She voiced understanding and wishes to proceed.  Current Plans Pt Education - Pamphlet Given - Laparoscopic Gallbladder Surgery: discussed with patient and provided information. Started OxyCODONE HCl 5MG/5ML, 5-10 Milliliter every four hours, as needed, 200 Milliliter, 04/27/2015, No Refill. Started Zofran ODT 4MG, 1 (one) Tablet Disperse every six hours, as needed, #20, 04/27/2015, No Refill.  Leighton Ruff. Redmond Pulling, MD, FACS General, Bariatric, & Minimally Invasive Surgery [facility], [place]

## 2015-05-01 NOTE — Interval H&P Note (Signed)
History and Physical Interval Note:  05/01/2015 8:31 AM  Lindsey Keller  has presented today for surgery, with the diagnosis of Morbid Obesity  The various methods of treatment have been discussed with the patient and family. After consideration of risks, benefits and other options for treatment, the patient has consented to  Procedure(s): LAPAROSCOPIC ROUX-EN-Y GASTRIC BYPASS WITH HIATAL HERNIA REPAIR-POSSIBLE CHOLECYSTECTOMY (N/A) LAPAROSCOPIC CHOLECYSTECTOMY WITH INTRAOPERATIVE CHOLANGIOGRAM (N/A) as a surgical intervention .  The patient's history has been reviewed, patient examined, no change in status, stable for surgery.  I have reviewed the patient's chart and labs.  Questions were answered to the patient's satisfaction.    Leighton Ruff. Redmond Pulling, MD, [place], Bariatric, & Minimally Invasive Surgery [facility], [place]    [facility] M

## 2015-05-01 NOTE — Anesthesia Procedure Notes (Addendum)
Procedure Name: Intubation Date/Time: 05/01/2015 9:00 AM Performed by: Deliah Boston Pre-anesthesia Checklist: Patient identified, Emergency Drugs available, Suction available and Patient being monitored Patient Re-evaluated:Patient Re-evaluated prior to inductionOxygen Delivery Method: Circle System Utilized Preoxygenation: Pre-oxygenation with 100% oxygen Intubation Type: IV induction Ventilation: Mask ventilation without difficulty Laryngoscope Size: Mac and 3 Tube type: Oral Tube size: 7.5 mm Number of attempts: 1 Airway Equipment and Method: Oral airway Placement Confirmation: ETT inserted through vocal cords under direct vision,  positive ETCO2 and breath sounds checked- equal and bilateral Secured at: 23 cm Tube secured with: Tape Dental Injury: Teeth and Oropharynx as per pre-operative assessment  Comments: Smooth IV induction, RSI, AOI. When patient hooked up to anesthesia machine, machine malfunction immediatly realized and patient connected patient to O2 tank and ventilated via ambubag, + Bil breath sounds. Patient sat's remain > 95 %. Called for additional help, anesthesia machine corrected, patient placed on anesthesia machine.

## 2015-05-01 NOTE — Anesthesia Postprocedure Evaluation (Signed)
  Anesthesia Post-op Note  Patient: Clent Ridges Plamondon  Procedure(s) Performed: Procedure(s): LAPAROSCOPIC ROUX-EN-Y GASTRIC BYPASS WITH HIATAL HERNIA REPAIR-POSSIBLE CHOLECYSTECTOMY (N/A) LAPAROSCOPIC CHOLECYSTECTOMY WITH INTRAOPERATIVE CHOLANGIOGRAM (N/A)  Patient Location: PACU  Anesthesia Type:General  Level of Consciousness: awake  Airway and Oxygen Therapy: Patient Spontanous Breathing  Post-op Pain: mild  Post-op Assessment: Post-op Vital signs reviewed              Post-op Vital Signs: Reviewed  Last Vitals:  Filed Vitals:   05/01/15 1403  BP: 119/69  Pulse: 89  Temp: 36.6 C  Resp: 14    Complications: No apparent anesthesia complications

## 2015-05-01 NOTE — Progress Notes (Signed)
Pt attempted to dangle and ambulate post-op. Upon sitting up in the bed pt felt very dizzy and nauseous. Zofran administered IV. Pt dangled for several minutes until she thought that she felt well enough to stand. Upon standing, pt very unsteady wobbly on feet, and pt became flushed with nausea again. Pt instructed to sit back down. MD at [place] paged and called back to increase fluid to 150 ml/hr and have her attempt to ambulate again tonight prior to going to sleep. Pt back in bed and states that she feels better laying back in bed versus sitting up.

## 2015-05-01 NOTE — Op Note (Signed)
Lindsey Keller [id number] Dec 15, 1955. 05/01/2015  Preoperative diagnosis:  1. Morbid obesity (BMI 45)  2. Osteoarthritis of knee 3. Barrett's esophagus with esophagitis 4. Diabetes Mellitus 2 5. Lumbosacral back pain 6. Hypercholesterolemia with high triglycerides 7. Symptomatic cholelithiasis  Postoperative  diagnosis:  1. Morbid obesity (BMI 45)  2. Osteoarthritis of knee 3. Barrett's esophagus with esophagitis 4. Diabetes Mellitus 2 5. Lumbosacral back pain 6. Hypercholesterolemia with high triglycerides 7. Acute calculous cholecystitis  Surgical procedure: Laparoscopic Roux-en-Y gastric bypass (ante-colic, ante-gastric); upper endoscopy; laparoscopic cholecystectomy with IOC  Surgeon: Gayland Curry, M.D. FACS  Asst.: Alphonsa Overall MD FACS  Anesthesia: General plus 19JK exparel  Complications: None   EBL: Minimal   Drains: None   Disposition: PACU in good condition   Indications for procedure: 59yo female with morbid obesity who has been unsuccessful at sustained weight loss. The patient's comorbidities are listed above. We discussed the risk and benefits of surgery including but not limited to anesthesia risk, bleeding, infection, blood clot formation, anastomotic leak, anastomotic stricture, ulcer formation, death, respiratory complications, intestinal blockage, internal hernia, gallstone formation, vitamin and nutritional deficiencies, injury to surrounding structures, failure to lose weight and mood changes. She was found to also have gallstones on u/s preop and developed symptoms c/w with gallbladder disease and we decided to also do cholecystectomy as well. Please see chart for additional details regarding that discussion.   Description of procedure: Patient is brought to the operating room and general anesthesia induced. The patient had received preoperative broad-spectrum IV antibiotics and subcutaneous heparin. The abdomen was widely sterilely prepped with  Chloraprep and draped. Patient timeout was performed and correct patient and procedure confirmed. Access was obtained with a 12 mm Optiview trocar in the left upper quadrant and pneumoperitoneum established without difficulty. Under direct vision 12 mm trocars were placed laterally in the right upper quadrant, right upper quadrant midclavicular line, and to the left and above the umbilicus for the camera port. A 5 mm trocar was placed laterally in the left upper quadrant. Her gallbladder was distended and appeared to have some edema within the wall.  The omentum was brought into the upper abdomen and the transverse mesocolon elevated and the ligament of Treitz clearly identified. A 40 cm biliopancreatic limb was then carefully measured from the ligament of Treitz. The small intestine was divided at this point with a single firing of the white load linear stapler. A Penrose drain was sutured to the end of the Roux-en-Y limb for later identification. A 100 cm Roux-en-Y limb was then carefully measured. At this point a side-to-side anastomosis was created between the Roux limb and the end of the biliopancreatic limb. This was accomplished with a single firing of the 60 mm white load linear stapler. The common enterotomy was closed with a running 2-0 Vicryl begun at either end of the enterotomy and tied centrally. Eviceal tissue sealant was placed over the anastomosis. The mesenteric defect was then closed with running 2-0 silk. The omentum was then divided with the harmonic scalpel up towards the transverse colon to allow mobility of the Roux limb toward the gastric pouch. The patient was then placed in steep reversed Trendelenburg. Through a 5 mm subxiphoid site the [facility] retractor was placed and the left lobe of the liver elevated with excellent exposure of the upper stomach and hiatus. The angle of Hiss was then mobilized with the harmonic scalpel. A 4-5 cm gastric pouch was then carefully measured along the  lesser curve of the  stomach. Dissection was carried along the lesser curve at this point with the Harmonic scalpel working carefully back toward the lesser sac at right angles to the lesser curve. The free lesser sac was then entered. After being sure all tubes were removed from the stomach an initial firing of the gold load 60 mm linear stapler was fired at right angles across the lesser curve for about 4 cm. The gastric pouch was further mobilized posteriorly and then the pouch was completed with 3 further firings of the 60 mm blue load linear stapler up through the previously dissected angle of His. It was ensured that the pouch was completely mobilized away from the gastric remnant.This created a nice tubular 4-5 cm gastric pouch. Eviceal was placed on the last staple line firings on the gastric pouch. The Roux limb was then brought up in an antecolic fashion with the candycane facing to the patient's left without undue tension. The gastrojejunostomy was created with an initial posterior row of 2-0 Vicryl between the Roux limb and the staple line of the gastric pouch. Enterotomies were then made in the gastric pouch and the Roux limb with the harmonic scalpel and at approximately 2-2-1/2 cm anastomosis was created with a single firing of the 50mm blue load linear stapler. The staple line was inspected and was intact without bleeding. The common enterotomy was then closed with running 2-0 Vicryl begun at either end and tied centrally. I did placed 1 interrupted 2-0 vicryl endostitch near the left end side of the anastomosis. The Ewall tube was then easily passed through the anastomosis and an outer anterior layer of running 2-0 Vicryl was placed. The Ewald tube was removed. With the outlet of the gastrojejunostomy clamped and under saline irrigation the assistant performed upper endoscopy and with the gastric pouch tensely distended with air-there was no evidence of leak on this test. The pouch was desufflated.  The Terance Hart defect was closed with running 2-0 silk. The abdomen was inspected for any evidence of bleeding or bowel injury and everything looked fine. The Nathanson retractor was released under direct vision after coating the anastomosis with Eviceal tissue sealant.   We then proceeded to the gallbladder phase of the procedure. A right lateral 38mm trocar was added at this time to facilitate this portion of the procedure. We positioned the patient in reverse Trendelenburg, tilted slightly to the patient's left.  The gallbladder was identified. In order to grasp it, the gallbladder was aspirated. A few omental attachments to the liver were taken down in order to facilitate retraction on the gallbladder.  the fundus was grasped and retracted cephalad. Adhesions were lysed bluntly and with the electrocautery where indicated, taking care not to injure any adjacent organs or viscus. The wall was edematous.  The infundibulum was grasped and retracted laterally, exposing the peritoneum overlying the triangle of Calot. This was then divided and exposed in a blunt fashion. A critical view of the cystic duct and cystic artery was obtained.  The cystic duct was clearly identified and bluntly dissected circumferentially. The cystic duct was ligated with a clip distally.   An incision was made in the cystic duct and the [facility] cholangiogram catheter introduced. The catheter was secured using a clip. A cholangiogram was then obtained which showed good visualization of the distal and proximal biliary tree with no sign of filling defects or obstruction.  Contrast flowed easily into the duodenum. The catheter was then removed.   The cystic duct was then ligated with clips  and divided. The cystic artery which had been identified and dissected free was ligated with clips and divided as well.   The gallbladder was dissected from the liver bed in retrograde fashion with the electrocautery. There was some bile spillage from the  gallbladder. The gallbladder was removed and placed in an Ecco sac.  The gallbladder and Ecco sac were then removed through the mid right abdominal port site - somewhat in pieces and somewhat intact. The liver bed was irrigated and inspected. Hemostasis was achieved with the electrocautery. Copious irrigation was utilized and was repeatedly aspirated until clear.   All CO2 was evacuated and trochars removed. Skin incisions were closed with 4-0 monocryl in a subcuticular fashion followed by Dermabond. Sponge needle and instrument counts were correct. The patient was taken to the PACU in good condition.    Leighton Ruff. Redmond Pulling, MD, FACS General, Bariatric, & Minimally Invasive Surgery [facility] Surgery, [place]

## 2015-05-02 LAB — COMPREHENSIVE METABOLIC PANEL
ALBUMIN: 3.4 g/dL — AB (ref 3.5–5.0)
ALT: 41 U/L (ref 14–54)
AST: 44 U/L — ABNORMAL HIGH (ref 15–41)
Alkaline Phosphatase: 71 U/L (ref 38–126)
Anion gap: 6 (ref 5–15)
BUN: 9 mg/dL (ref 6–20)
CHLORIDE: 106 mmol/L (ref 101–111)
CO2: 28 mmol/L (ref 22–32)
CREATININE: 0.8 mg/dL (ref 0.44–1.00)
Calcium: 8.4 mg/dL — ABNORMAL LOW (ref 8.9–10.3)
GFR calc non Af Amer: >60 mL/min (ref >60)
GLUCOSE: 94 mg/dL (ref 65–99)
Potassium: 4.1 mmol/L (ref 3.5–5.1)
SODIUM: 140 mmol/L (ref 135–145)
Total Bilirubin: 0.6 mg/dL (ref 0.3–1.2)
Total Protein: 6.6 g/dL (ref 6.5–8.1)

## 2015-05-02 LAB — CBC WITH DIFFERENTIAL/PLATELET
BASOS ABS: 0 10*3/uL (ref 0.0–0.1)
BASOS PCT: 0 %
EOS ABS: 0.1 10*3/uL (ref 0.0–0.7)
Eosinophils Relative: 1 %
HCT: 37.1 % (ref 36.0–46.0)
HEMOGLOBIN: 12.2 g/dL (ref 12.0–15.0)
Lymphocytes Relative: 19 %
Lymphs Abs: 1.8 10*3/uL (ref 0.7–4.0)
MCH: 32.4 pg (ref 26.0–34.0)
MCHC: 32.9 g/dL (ref 30.0–36.0)
MCV: 98.7 fL (ref 78.0–100.0)
MONOS PCT: 6 %
Monocytes Absolute: 0.6 10*3/uL (ref 0.1–1.0)
NEUTROS ABS: 6.8 10*3/uL (ref 1.7–7.7)
NEUTROS PCT: 74 %
PLATELETS: 254 10*3/uL (ref 150–400)
RBC: 3.76 MIL/uL — AB (ref 3.87–5.11)
RDW: 13.2 % (ref 11.5–15.5)
WBC: 9.3 10*3/uL (ref 4.0–10.5)

## 2015-05-02 LAB — GLUCOSE, CAPILLARY
GLUCOSE-CAPILLARY: 175 mg/dL — AB (ref 65–99)
GLUCOSE-CAPILLARY: 96 mg/dL (ref 65–99)
GLUCOSE-CAPILLARY: 101 mg/dL — AB (ref 65–99)
Glucose-Capillary: 81 mg/dL (ref 65–99)
Glucose-Capillary: 79 mg/dL (ref 65–99)
Glucose-Capillary: 96 mg/dL (ref 65–99)
Glucose-Capillary: 82 mg/dL (ref 65–99)

## 2015-05-02 LAB — HEMOGLOBIN AND HEMATOCRIT, BLOOD
HEMATOCRIT: 38.5 % (ref 36.0–46.0)
Hemoglobin: 12.6 g/dL (ref 12.0–15.0)

## 2015-05-02 MED ORDER — VENLAFAXINE HCL 37.5 MG PO TABS
18.0000 mg | ORAL_TABLET | Freq: Two times a day (BID) | ORAL | Status: DC
  Administered 2015-05-02 – 2015-05-03 (×2): 18.75 mg via ORAL
  Filled 2015-05-02 (×4): qty 0.5

## 2015-05-02 NOTE — Care Management Note (Signed)
Case Management Note  Patient Details  Name: LEGNA MAUSOLF MRN: [id number] Date of Birth: 1956-05-23  Subjective/Objective:      Laparoscopic Roux-en-Y gastric bypass               Action/Plan: Discharge planning  Expected Discharge Date:                  Expected Discharge Plan:  Home/Self Care  In-House Referral:  NA  Discharge planning Services  CM Consult  Post Acute Care Choice:  NA Choice offered to:  NA  DME Arranged:  N/A DME Agency:  NA  HH Arranged:  NA HH Agency:  NA  Status of Service:  Completed, signed off  Medicare Important Message Given:    Date Medicare IM Given:    Medicare IM give by:    Date Additional Medicare IM Given:    Additional Medicare Important Message give by:     If discussed at [place] of Stay Meetings, dates discussed:    Additional Comments:  Guadalupe Maple, RN 05/02/2015, 8:50 AM

## 2015-05-02 NOTE — Progress Notes (Signed)
Patient alert and oriented, Post op day 1.  Provided support and encouragement.  Encouraged pulmonary toilet, ambulation and small sips of liquids.  All questions answered.  Will continue to monitor. 

## 2015-05-02 NOTE — Progress Notes (Signed)
1 Day Post-Op  Subjective: Had a lot of nausea overnight but improved this am. Some discomfort in epigastrium. Ambulated. No emesis. No fever  Objective: Vital signs in last 24 hours: Temp:  [97.7 F (36.5 C)-98.9 F (37.2 C)] 98.1 F (36.7 C) (10/04 0950) Pulse Rate:  [83-112] 88 (10/04 0950) Resp:  [8-20] 18 (10/04 0950) BP: (113-148)/(64-83) 128/68 mmHg (10/04 0950) SpO2:  [93 %-100 %] 93 % (10/04 0950) Weight:  [114.76 kg (253 lb)] 114.76 kg (253 lb) (10/03 1403) Last BM Date: 05/01/15  Intake/Output from previous day: 10/03 0701 - 10/04 0700 In: 4940.8 [I.V.:4380.8; IV Piggyback:560] Out: 2250 [Urine:2250] Intake/Output this shift: Total I/O In: 0  Out: 500 [Urine:500]  Alert, nontoxic, well appearing cta b/l Reg Obese, soft, min to no TTP; incisions c/d/i +SCDs  Lab Results:   Recent Labs  05/01/15 1341 05/02/15 0557  WBC  --  9.3  HGB 13.1 12.2  HCT 39.6 37.1  PLT  --  254   BMET  Recent Labs  05/02/15 0557  NA 140  K 4.1  CL 106  CO2 28  GLUCOSE 94  BUN 9  CREATININE 0.80  CALCIUM 8.4*   PT/INR No results for input(s): LABPROT, INR in the last 72 hours. ABG No results for input(s): PHART, HCO3 in the last 72 hours.  Invalid input(s): PCO2, PO2  Studies/Results: Dg Cholangiogram Operative  05/01/2015   CLINICAL DATA:  59 year old female with a history of cholelithiasis  EXAM: INTRAOPERATIVE CHOLANGIOGRAM  TECHNIQUE: Cholangiographic images from the C-arm fluoroscopic device were submitted for interpretation post-operatively. Please see the procedural report for the amount of contrast and the fluoroscopy time utilized.  COMPARISON:  04/27/2015  FINDINGS: Surgical instruments project over the upper abdomen.  There is cannulation of the cystic duct/gallbladder neck, with antegrade infusion of contrast. Caliber of the extrahepatic ductal system within normal limits.  No large filling defect identified.  Free flow of contrast across the ampulla.   IMPRESSION: Intraoperative cholangiogram demonstrates extrahepatic biliary ducts of unremarkable caliber, with no large filling defect identified. Free flow of contrast across the ampulla.  Please refer to the dictated operative report for full details of intraoperative findings and procedure  Signed,  Dulcy Fanny. Earleen Newport, DO  [facility]  [facility]   Electronically Signed   By: Corrie Mckusick D.O.   On: 05/01/2015 12:09    Anti-infectives: Anti-infectives    Start     Dose/Rate Route Frequency Ordered Stop   05/01/15 0732  levofloxacin (LEVAQUIN) IVPB 750 mg     750 mg 100 mL/hr over 90 Minutes Intravenous On call to O.R. 05/01/15 0732 05/01/15 0904      Assessment/Plan: s/p Procedure(s): LAPAROSCOPIC ROUX-EN-Y GASTRIC BYPASS WITH HIATAL HERNIA REPAIR-POSSIBLE CHOLECYSTECTOMY (N/A) LAPAROSCOPIC CHOLECYSTECTOMY WITH INTRAOPERATIVE CHOLANGIOGRAM (N/A)  Discussed intraop findings Looks good. No fever, no tachycardia. Will start POD 1 diet Cont VTE prophylaxis Antiemetics as needed pulm toilet  Leighton Ruff. Redmond Pulling, MD, FACS General, Bariatric, & Minimally Invasive Surgery [facility], [place]   LOS: 1 day    Gayland Curry 05/02/2015

## 2015-05-02 NOTE — Plan of Care (Signed)
Problem: Food- and Nutrition-Related Knowledge Deficit (NB-1.1) Goal: Nutrition education Formal process to instruct or train a patient/client in a skill or to impart knowledge to help patients/clients voluntarily manage or modify food choices and eating behavior to maintain or improve health. Outcome: Completed/Met Date Met:  05/02/15 Nutrition Education Note  Received consult for diet education per DROP protocol.   Pt reports buying a multivitamin in patch form. Discussed this supplement patch with RD at [facility]. It is not recommended that the patient use this form of multivitamin as there is not enough research to support their use and that they provide the nutrient needs of the patient.  Discussed 2 week post op diet with pt. Emphasized that liquids must be non carbonated, non caffeinated, and sugar free. Fluid goals discussed. Pt to follow up with outpatient bariatric RD for further diet progression after 2 weeks. Multivitamins and minerals also reviewed. Teach back method used, pt expressed understanding, expect good compliance.   Diet: First 2 Weeks  You will see the nutritionist about two (2) weeks after your surgery. The nutritionist will increase the types of foods you can eat if you are handling liquids well:  If you have severe vomiting or nausea and cannot handle clear liquids lasting longer than 1 day, call your surgeon  Protein Shake  Drink at least 2 ounces of shake 5-6 times per day  Each serving of protein shakes (usually 8 - 12 ounces) should have a minimum of:  15 grams of protein  And no more than 5 grams of carbohydrate  Goal for protein each day:  Men = 80 grams per day  Women = 60 grams per day  Protein powder may be added to fluids such as non-fat milk or Lactaid milk or Soy milk (limit to 35 grams added protein powder per serving)   Hydration  Slowly increase the amount of water and other clear liquids as tolerated (See Acceptable Fluids)  Slowly increase the amount  of protein shake as tolerated  Sip fluids slowly and throughout the day  May use sugar substitutes in small amounts (no more than 6 - 8 packets per day; i.e. Splenda)   Fluid Goal  The first goal is to drink at least 8 ounces of protein shake/drink per day (or as directed by the nutritionist); some examples of protein shakes are [employer], [employer]R [employer], EAS Edge HP, and Unjury. See handout from pre-op Bariatric Education Class:  Slowly increase the amount of protein shake you drink as tolerated  You may find it easier to slowly sip shakes throughout the day  It is important to get your proteins in first  Your fluid goal is to drink 64 - 100 ounces of fluid daily  It may take a few weeks to build up to this  32 oz (or more) should be clear liquids  And  32 oz (or more) should be full liquids (see below for examples)  Liquids should not contain sugar, caffeine, or carbonation   Clear Liquids:  Water or Sugar-free flavored water (i.e. Fruit H2O, Propel)  Decaffeinated coffee or tea (sugar-free)  Crystal Lite, Wyler's Lite, Minute Maid Lite  Sugar-free Jell-O  Bouillon or broth  Sugar-free Popsicle: *Less than 20 calories each; Limit 1 per day   Full Liquids:  Protein Shakes/Drinks + 2 choices per day of other full liquids  Full liquids must be:  No More Than 12 grams of Carbs per serving  No More Than 3 grams of Fat per serving  Strained low-fat cream soup  Non-Fat milk  Fat-free Lactaid Milk  Sugar-free yogurt (Dannon Lite & Fit, Greek yogurt)     Clayton Bibles, MS, RD, LDN Pager: [phone] After Hours Pager: [phone]

## 2015-05-03 LAB — CBC WITH DIFFERENTIAL/PLATELET
BASOS ABS: 0 10*3/uL (ref 0.0–0.1)
BASOS PCT: 0 %
EOS PCT: 3 %
Eosinophils Absolute: 0.3 10*3/uL (ref 0.0–0.7)
HEMATOCRIT: 37.9 % (ref 36.0–46.0)
Hemoglobin: 12.7 g/dL (ref 12.0–15.0)
LYMPHS PCT: 17 %
Lymphs Abs: 1.6 10*3/uL (ref 0.7–4.0)
MCH: 33.2 pg (ref 26.0–34.0)
MCHC: 33.5 g/dL (ref 30.0–36.0)
MCV: 99 fL (ref 78.0–100.0)
MONO ABS: 0.5 10*3/uL (ref 0.1–1.0)
Monocytes Relative: 6 %
NEUTROS ABS: 6.8 10*3/uL (ref 1.7–7.7)
Neutrophils Relative %: 74 %
PLATELETS: 252 10*3/uL (ref 150–400)
RBC: 3.83 MIL/uL — AB (ref 3.87–5.11)
RDW: 13.1 % (ref 11.5–15.5)
WBC: 9.3 10*3/uL (ref 4.0–10.5)

## 2015-05-03 LAB — GLUCOSE, CAPILLARY
GLUCOSE-CAPILLARY: 72 mg/dL (ref 65–99)
GLUCOSE-CAPILLARY: 79 mg/dL (ref 65–99)
Glucose-Capillary: 92 mg/dL (ref 65–99)

## 2015-05-03 MED ORDER — ACETAMINOPHEN 160 MG/5ML PO SOLN: 325.0000 mg | ORAL | Status: AC | PRN

## 2015-05-03 MED ORDER — VENLAFAXINE HCL 37.5 MG PO TABS: 18.0000 mg | ORAL_TABLET | Freq: Two times a day (BID) | ORAL | Status: DC

## 2015-05-03 MED ORDER — ONDANSETRON 4 MG PO TBDP: 4.0000 mg | ORAL_TABLET | Freq: Three times a day (TID) | ORAL | Status: DC | PRN

## 2015-05-03 MED ORDER — OXYCODONE HCL 5 MG/5ML PO SOLN: 5.0000 mg | ORAL | Status: DC | PRN

## 2015-05-03 NOTE — Progress Notes (Signed)
Patient alert and oriented, pain is controlled. Patient is tolerating fluids, advanced to protein shake today, patient tolerating well. Reviewed Gastric Bypass discharge instructions with patient and patient is able to articulate understanding. Provided information on BELT program, Support Group and [facility] outpatient pharmacy. All questions answered, will continue to monitor.

## 2015-05-03 NOTE — Discharge Summary (Signed)
Physician Discharge Summary  Lindsey Keller TGG:[id number] DOB: 1956/04/01 DOA: 05/01/2015  PCP: [name], MD  Admit date: 05/01/2015 Discharge date: 05/03/2015  Recommendations for Outpatient Follow-up:   Follow-up Information    Follow up with Gayland Curry, MD. Go on 05/18/2015.   Specialty:  General Surgery   Why:  For Post-Op Check at 10:15AM   Contact information:   [address] [place] [address] [phone]      Discharge Diagnoses:  Principal Problem:   Obesity, Class III, BMI 40-49.9 (morbid obesity) ([place]) Active Problems:   DM type 2 (diabetes mellitus, type 2) ([place])   Acute calculous cholecystitis   Hypercholesterolemia with hypertriglyceridemia   Barrett's esophagus with esophagitis   Lumbosacral pain   Osteoarthritis, knee   S/P gastric bypass s/p Lap chole with ioc  Surgical Procedure: Laparoscopic Roux-en-Y gastric bypass, upper endoscopy, Laparoscopic cholecystectomy with IOC  Discharge Condition: Good Disposition: Home  Diet recommendation: Postoperative gastric bypass diet  Filed Weights   05/01/15 0732 05/01/15 1403  Weight: 114.93 kg (253 lb 6 oz) 114.76 kg (253 lb)     Hospital Course:  The patient was admitted for a planned laparoscopic Roux-en-Y gastric bypass and cholecystectomy. Please see operative note. She ended up having acute cholecystitis at time of surgery. Preoperatively the patient was given 5000 units of subcutaneous heparin for DVT prophylaxis. Postoperative prophylactic Lovenox dosing was started on the morning of postoperative day 1. On POD1 she had no fever or tachycardia and she patient was started on ice chips and water which they tolerated. On postoperative day 2 The patient's diet was advanced to protein shakes which they also tolerated. The patient was ambulating without difficulty. Their vital signs are stable without fever or tachycardia. Their hemoglobin had remained stable.  The patient had  received discharge instructions and counseling. They were deemed stable for discharge.  On morning of discharge she was doing well tolerating water and shake.  BP 126/75 mmHg  Pulse 95  Temp(Src) 98.6 F (37 C) (Oral)  Resp 18  Ht 5\' 3"  (1.6 m)  Wt 114.76 kg (253 lb)  BMI 44.83 kg/m2  SpO2 97%  Gen: alert, NAD, non-toxic appearing Pupils: equal, no scleral icterus Pulm: Lungs clear to auscultation, symmetric chest rise CV: regular rate and rhythm Abd: soft, nontender, nondistended.  No cellulitis. No incisional hernia Ext: no edema, no calf tenderness Skin: no rash, no jaundice    Discharge Instructions  Discharge Instructions    Ambulate hourly while awake    Complete by:  As directed      Call MD for:  difficulty breathing, headache or visual disturbances    Complete by:  As directed      Call MD for:  persistant dizziness or light-headedness    Complete by:  As directed      Call MD for:  persistant nausea and vomiting    Complete by:  As directed      Call MD for:  redness, tenderness, or signs of infection (pain, swelling, redness, odor or green/yellow discharge around incision site)    Complete by:  As directed      Call MD for:  severe uncontrolled pain    Complete by:  As directed      Call MD for:  temperature >101 F    Complete by:  As directed      Diet bariatric full liquid    Complete by:  As directed      Discharge  instructions    Complete by:  As directed   See bariatric discharge instructions     Incentive spirometry    Complete by:  As directed   Perform hourly while awake            Medication List    STOP taking these medications        dicyclomine 20 MG tablet  Commonly known as:  BENTYL     metFORMIN 500 MG tablet  Commonly known as:  GLUCOPHAGE     nitrofurantoin (macrocrystal-monohydrate) 100 MG capsule  Commonly known as:  MACROBID     venlafaxine XR 37.5 MG 24 hr capsule  Commonly known as:  EFFEXOR-XR  Replaced by:   venlafaxine 37.5 MG tablet      TAKE these medications        acetaminophen 160 MG/5ML solution  Commonly known as:  TYLENOL  Take 10.2-20.3 mLs (325-650 mg total) by mouth every 4 (four) hours as needed for moderate pain (severe pain).     BIOTIN PO  Take 1 tablet by mouth daily.     calcium carbonate 1250 (500 CA) MG tablet  Commonly known as:  OS-CAL - dosed in mg of elemental calcium  Take 1 tablet by mouth daily.     cholecalciferol 1000 UNITS tablet  Commonly known as:  VITAMIN D  Take 1,000 Units by mouth daily.     multivitamin with minerals Tabs tablet  Take 1 tablet by mouth daily.     omeprazole 40 MG capsule  Commonly known as:  PRILOSEC  Take 40 mg by mouth 2 (two) times daily as needed (heartburn).     ondansetron 4 MG disintegrating tablet  Commonly known as:  ZOFRAN ODT  Take 1 tablet (4 mg total) by mouth every 8 (eight) hours as needed for nausea or vomiting.     oxyCODONE 5 MG/5ML solution  Commonly known as:  ROXICODONE  Take 5-10 mLs (5-10 mg total) by mouth every 4 (four) hours as needed for moderate pain or severe pain.     venlafaxine 37.5 MG tablet  Commonly known as:  EFFEXOR  Take 0.5 tablets (18.75 mg total) by mouth 2 (two) times daily with a meal.     vitamin B-12 500 MCG tablet  Commonly known as:  CYANOCOBALAMIN  Take 500 mcg by mouth daily.           Follow-up Information    Follow up with Gayland Curry, MD. Go on 05/18/2015.   Specialty:  General Surgery   Why:  For Post-Op Check at 10:15AM   Contact information:   [address] [place] [id number] [phone]        The results of significant diagnostics from this hospitalization (including imaging, microbiology, ancillary and laboratory) are listed below for reference.    Significant Diagnostic Studies: Dg Cholangiogram Operative  05/01/2015   CLINICAL DATA:  59 year old female with a history of cholelithiasis  EXAM: INTRAOPERATIVE CHOLANGIOGRAM   TECHNIQUE: Cholangiographic images from the C-arm fluoroscopic device were submitted for interpretation post-operatively. Please see the procedural report for the amount of contrast and the fluoroscopy time utilized.  COMPARISON:  04/27/2015  FINDINGS: Surgical instruments project over the upper abdomen.  There is cannulation of the cystic duct/gallbladder neck, with antegrade infusion of contrast. Caliber of the extrahepatic ductal system within normal limits.  No large filling defect identified.  Free flow of contrast across the ampulla.  IMPRESSION: Intraoperative cholangiogram demonstrates extrahepatic biliary ducts of  unremarkable caliber, with no large filling defect identified. Free flow of contrast across the ampulla.  Please refer to the dictated operative report for full details of intraoperative findings and procedure  Signed,  Dulcy Fanny. Earleen Newport, DO  [facility]  [facility]   Electronically Signed   By: Corrie Mckusick D.O.   On: 05/01/2015 12:09   Ct Renal Stone Study  04/27/2015   CLINICAL DATA:  Epigastric pain for 4 hours, nausea, vomiting and hematuria. History of kidney stones, hysterectomy, cholecystectomy, diabetes, gastric bypass surgery, LEFT breast cancer, status post chemo radiation.  EXAM: CT ABDOMEN AND PELVIS WITHOUT CONTRAST  TECHNIQUE: Multidetector CT imaging of the abdomen and pelvis was performed following the standard protocol without IV contrast.  COMPARISON:  Abdominal ultrasound March 09, 2015  FINDINGS: LUNG BASES: 6 mm RIGHT lower lobe sub solid pulmonary nodule. 3 mm RIGHT lower lobe pulmonary nodule. 4 mm LEFT lower lobe pulmonary nodule. RIGHT middle lobe atelectasis/scarring.  KIDNEYS/BLADDER: Kidneys are orthotopic, demonstrating normal size and morphology. No nephrolithiasis, hydronephrosis; limited assessment for renal masses on this nonenhanced examination. The unopacified ureters are normal in course and caliber. Urinary  bladder is partially distended and unremarkable.  SOLID ORGANS: The liver, spleen, pancreas and adrenal glands are unremarkable for this non-contrast examination. Gallbladder is distended with multiple noncalcified small gallstones, no CT findings of acute cholecystitis.  GASTROINTESTINAL TRACT: Tiny hiatal hernia. The stomach, small and large bowel are normal in course and caliber without inflammatory changes, the sensitivity may be decreased by lack of enteric contrast. Mild colonic diverticulosis. Normal appendix.  PERITONEUM/RETROPERITONEUM: Aortoiliac vessels are normal in course and caliber. No lymphadenopathy by CT size criteria. Status post hysterectomy. No intraperitoneal free fluid nor free air.  SOFT TISSUES/ OSSEOUS STRUCTURES: Nonsuspicious. Severe lower lumbar facet arthropathy. Severe T11-12 disc height loss, vacuum disc, endplate sclerosis consistent with degenerative disc.  IMPRESSION: No urolithiasis, obstructive uropathy nor acute intra-abdominal/ pelvic process.  Multiple subcentimeter pulmonary nodules. Given patient's history of lung cancer, recommend follow-up CT of the chest on a nonemergent basis.  Cholelithiasis.  Mild colonic diverticulosis.   Electronically Signed   By: Elon Alas M.D.   On: 04/27/2015 06:53    Labs: Basic Metabolic Panel:  Recent Labs Lab 04/27/15 0607 05/02/15 0557  NA 136 140  K 4.1 4.1  CL 101 106  CO2 24 28  GLUCOSE 139* 94  BUN 33* 9  CREATININE 1.06* 0.80  CALCIUM 9.2 8.4*   Liver Function Tests:  Recent Labs Lab 04/27/15 0607 05/02/15 0557  AST 30 44*  ALT 32 41  ALKPHOS 85 71  BILITOT 0.8 0.6  PROT 7.7 6.6  ALBUMIN 3.9 3.4*    CBC:  Recent Labs Lab 04/27/15 0607 05/01/15 1341 05/02/15 0557 05/02/15 1620 05/03/15 0605  WBC 12.0*  --  9.3  --  9.3  NEUTROABS 9.7*  --  6.8  --  6.8  HGB 14.3 13.1 12.2 12.6 12.7  HCT 41.9 39.6 37.1 38.5 37.9  MCV 95.7  --  98.7  --  99.0  PLT 294  --  254  --  252     CBG:  Recent Labs Lab 05/02/15 1632 05/02/15 1958 05/02/15 2347 05/03/15 0409 05/03/15 0740  GLUCAP 101* 82 92 72 79    Principal Problem:   Obesity, Class III, BMI 40-49.9 (morbid obesity) ([facility]) Active Problems:   DM type 2 (diabetes mellitus, type 2) ([facility])   Acute calculous cholecystitis   Hypercholesterolemia with hypertriglyceridemia  Barrett's esophagus with esophagitis   Lumbosacral pain   Osteoarthritis, knee   S/P gastric bypass   Time coordinating discharge: 15 minutes  Signed:  Gayland Curry, MD [facility], [place] [phone] 05/03/2015, 1:49 PM

## 2015-05-03 NOTE — Discharge Instructions (Signed)

## 2015-05-08 ENCOUNTER — Telehealth (HOSPITAL_COMMUNITY): Payer: Self-pay

## 2015-05-08 NOTE — Telephone Encounter (Signed)
Made discharge phone call to patient per DROP protocol. Asking the following questions.    1. Do you have someone to care for you now that you are home?  yes 2. Are you having pain now that is not relieved by your pain medication?  no 3. Are you able to drink the recommended daily amount of fluids (48 ounces minimum/day) and protein (60-80 grams/day) as prescribed by the dietitian or nutritional counselor?  yes 4. Are you taking the vitamins and minerals as prescribed?  yes 5. Do you have the "on call" number to contact your surgeon if you have a problem or question?  yes 6. Are your incisions free of redness, swelling or drainage? (If steri strips, address that these can fall off, shower as tolerated) no 7. Have your bowels moved since your surgery?  If not, are you passing gas?  yes 8. Are you up and walking 3-4 times per day?  yes

## 2015-05-16 ENCOUNTER — Encounter: Payer: BLUE CROSS/BLUE SHIELD | Attending: General Surgery

## 2015-05-16 VITALS — Ht 63.0 in | Wt 244.0 lb

## 2015-05-16 DIAGNOSIS — Z6841 Body Mass Index (BMI) 40.0 and over, adult: Secondary | ICD-10-CM | POA: Insufficient documentation

## 2015-05-16 DIAGNOSIS — Z713 Dietary counseling and surveillance: Secondary | ICD-10-CM | POA: Diagnosis not present

## 2015-05-16 NOTE — Progress Notes (Signed)
Bariatric Class:  Appt start time: 1530 end time:  1630.  2 Week Post-Operative Nutrition Class  Patient was seen on 05/16/15 for Post-[facility]ative Nutrition [facility].   Surgery date: 05/01/15 Surgery type: RYGB Start weight at [facility]: 259 lbs on 03/01/15 Weight today: 244.0  lbs  Weight change: 14.9 lbs  TANITA  BODY COMP RESULTS  04/10/15 05/16/15   BMI (kg/m^2) N/A 43.2   Fat Mass (lbs)  131.5   Fat Free Mass (lbs)  112.5   Total Body Water (lbs)  82.5     The following the learning objectives were met by the patient during this course:  Identifies Phase 3A (Soft, High Proteins) Dietary Goals and will begin from 2 weeks post-operatively to 2 months post-operatively  Identifies appropriate sources of fluids and proteins   States protein recommendations and appropriate sources post-operatively  Identifies the need for appropriate texture modifications, mastication, and bite sizes when consuming solids  Identifies appropriate multivitamin and calcium sources post-operatively  Describes the need for physical activity post-operatively and will follow MD recommendations  States when to call healthcare provider regarding medication questions or post-operative complications  Handouts given during class include:  Phase 3A: Soft, High Protein Diet Handout  Follow-Up Plan: Patient will follow-up at [facility] in 6 weeks for 2 month post-op nutrition visit for diet advancement per MD.

## 2015-06-28 ENCOUNTER — Encounter: Payer: BLUE CROSS/BLUE SHIELD | Attending: General Surgery | Admitting: Dietician

## 2015-06-28 VITALS — Ht 63.0 in | Wt 233.0 lb

## 2015-06-28 DIAGNOSIS — Z713 Dietary counseling and surveillance: Secondary | ICD-10-CM | POA: Diagnosis not present

## 2015-06-28 DIAGNOSIS — Z6841 Body Mass Index (BMI) 40.0 and over, adult: Secondary | ICD-10-CM | POA: Diagnosis not present

## 2015-06-28 NOTE — Progress Notes (Signed)
  Follow-up visit:  8 Weeks Post-Operative RYGB Surgery  Medical Nutrition Therapy:  Appt start time: 0230 end time:  320  Primary concerns today: Post-operative Bariatric Surgery Nutrition Management.  Lindsey Keller returns today having lost 11 pounds (18 lbs fat) since post op class. She is discouraged because she states that she has not lost any weight in the last few weeks. Has reduced her depression medications on her own (has not discussed this with a primary care doctor). She is in the process of finding a new PCP. Had cholecystectomy during RYGB. Having a lot of pain in her feet and joints, "I'm in agony". Also noticing a lot of swelling in face, hands, and ankles. Reports that when she lost significant weight rapidly in the past she experienced severe edema. She urinates 1-2x a day and reports that her urine is dark. [name] admits to not taking in enough fluid. Protein shakes making her sick unless she drinks them before bed. Not tolerating chicken well. Can eat 3-4 oz of meat at a time.   Samples provided and patient instructed on proper use: Unjury protein powder (unflavored - qty 2) Lot#: [id number] Exp: 01/2016  Surgery date: 05/01/15 Surgery type: RYGB Start weight at [facility]: 259 lbs on 03/01/15 (261 lbs) Weight today: 233 lbs Weight change: 11 lbs Total weight lost: 28 lbs  TANITA  BODY COMP RESULTS  04/10/15 05/16/15 06/28/15   BMI (kg/m^2) N/A 43.2 41.3   Fat Mass (lbs)  131.5 113.5   Fat Free Mass (lbs)  112.5 119.5   Total Body Water (lbs)  82.5 87.5    Preferred Learning Style:  No preference indicated   Learning Readiness:   Ready  24-hr recall: B (10-11 AM): egg with cheese (10g) Snk (AM):   L (PM): sometimes Laughing cow cheese (2g) Snk (PM):   D (6 PM): 3-4 oz steak and scallops (21-28g) Snk (11 PM): Premier protein shake (30g)  Fluid intake: insufficient per patient Estimated total protein intake: 63-70g  Medications: see list Supplementation: taking,  sometimes forgets Calcium  Using straws: yes Drinking while eating: sometimes Hair loss: no Carbonated beverages: no N/V/D/C: vomiting, diarrhea Dumping syndrome: cramping with yogurt and shakes (vomiting and diarrhea)  Recent physical activity:  none  Progress Towards Goal(s):  In progress.  Handouts given during visit include:  Phase 3B lean protein + non starchy vegetables   Nutritional Diagnosis:  [place]-3.3 Overweight/obesity related to past poor dietary habits and physical inactivity as evidenced by patient w/ recent RYGB surgery following dietary guidelines for continued weight loss.     Intervention:  Nutrition counseling provided.  Teaching Method Utilized:  Visual Auditory Hands on  Barriers to learning/adherence to lifestyle change: joint pain  Demonstrated degree of understanding via:  Teach Back   Monitoring/Evaluation:  Dietary intake, exercise, and body weight. Follow up in 1 months for 3 month post-op visit.

## 2015-06-28 NOTE — Patient Instructions (Addendum)
Goals:  Follow Phase 3B: High Protein + Non-Starchy Vegetables  Eat 3-6 small meals/snacks, every 3-5 hrs  Increase lean protein foods to meet 60g goal  Increase fluid intake to 64oz +  Avoid drinking 15 minutes before, during and 30 minutes after eating  Aim for >30 min of physical activity daily  TANITA  BODY COMP RESULTS  04/10/15 05/16/15 06/28/15   BMI (kg/m^2) N/A 43.2 41.3   Fat Mass (lbs)  131.5 113.5   Fat Free Mass (lbs)  112.5 119.5   Total Body Water (lbs)  82.5 87.5

## 2015-08-07 ENCOUNTER — Encounter: Payer: Self-pay | Admitting: Dietician

## 2015-08-07 ENCOUNTER — Encounter: Payer: BLUE CROSS/BLUE SHIELD | Attending: General Surgery | Admitting: Dietician

## 2015-08-07 DIAGNOSIS — Z713 Dietary counseling and surveillance: Secondary | ICD-10-CM | POA: Insufficient documentation

## 2015-08-07 DIAGNOSIS — Z6841 Body Mass Index (BMI) 40.0 and over, adult: Secondary | ICD-10-CM | POA: Diagnosis not present

## 2015-08-07 NOTE — Patient Instructions (Addendum)
Goals:  Follow Phase 3B: High Protein + Non-Starchy Vegetables  Eat 3-6 small meals/snacks, every 3-5 hrs  Increase lean protein foods to meet 60g goal  Increase fluid intake to 64oz +  Avoid drinking 15 minutes before, during and 30 minutes after eating  Aim for >30 min of physical activity daily  Call Dr. Redmond Pulling about diarrhea  Try eliminating dairy to see if diarrhea improves  Try to find a pattern with certain foods and whether they cause diarrhea  Eggs, fish, beef, [place] sausage, beans, chicken, [place], small amount of almonds, Quest protein bar   As you add dairy back, try low or non fat dairy products  Surgery date: 05/01/15 Surgery type: RYGB Start weight at [facility]: 259 lbs on 03/01/15 (261 lbs) Weight today: 213.5 lbs Weight change: 19.5 lbs Total weight lost:  47.5 lbs Weight loss goal: 140-165 lbs  TANITA  BODY COMP RESULTS  04/10/15 05/16/15 06/28/15 08/07/15   BMI (kg/m^2) N/A 43.2 41.3 37.8   Fat Mass (lbs)  131.5 113.5 102.5   Fat Free Mass (lbs)  112.5 119.5 111   Total Body Water (lbs)  82.5 87.5 81.5

## 2015-08-07 NOTE — Progress Notes (Signed)
  Follow-up visit:  3 months Post-Operative RYGB Surgery  Medical Nutrition Therapy:  Appt start time: [id number] end time:  330  Primary concerns today: Post-operative Bariatric Surgery Nutrition Management.  Lindsey Keller returns today having lost another 19.5 pounds. She states she has been very sick and unable to eat. Cannot tolerate protein shakes. Still having severe diarrhea as soon as she eats. Has stopped eating in the morning and eats only 1-2x a day. Has not told her surgeon about this. She states her "bowels have never been normal." Feels like if she has carbohydrates with meals it makes her feel better. Has an appt with Dr. Redmond Pulling in 2 weeks.  Not having any gas. Bowel movements are very light tan/yellow in color.   Surgery date: 05/01/15 Surgery type: RYGB Start weight at [facility]: 259 lbs on 03/01/15 (261 lbs) Weight today: 213.5 lbs Weight change: 19.5 lbs Total weight lost:  47.5 lbs Weight loss goal: 140-165 lbs  TANITA  BODY COMP RESULTS  04/10/15 05/16/15 06/28/15 08/07/15   BMI (kg/m^2) N/A 43.2 41.3 37.8   Fat Mass (lbs)  131.5 113.5 102.5   Fat Free Mass (lbs)  112.5 119.5 111   Total Body Water (lbs)  82.5 87.5 81.5    Preferred Learning Style:  No preference indicated   Learning Readiness:   Ready  24-hr recall: B (10-11 AM): sometimes skips, egg with cheese and 1 piece sausage (14g) Snk (AM):   L (PM): Snk (PM):   D (6 PM): 3 oz meatballs or scallops with vegetables (21g) Snk (PM):  Fluid intake: insufficient per patient Estimated total protein intake: unable to determine, insufficient  Medications: see list Supplementation: taking, still sometimes forgets Calcium  Using straws: yes Drinking while eating: sometimes Hair loss: no Carbonated beverages: no N/V/D/C: vomiting improving but still gags and heaves, diarrhea Dumping syndrome: cramping with yogurt and shakes (vomiting and diarrhea)  Recent physical activity:  Started BELT   Progress Towards Goal(s):   In progress.    Nutritional Diagnosis:  [place]-3.3 Overweight/obesity related to past poor dietary habits and physical inactivity as evidenced by patient w/ recent RYGB surgery following dietary guidelines for continued weight loss.     Intervention:  Nutrition counseling provided. Goals:  Follow Phase 3B: High Protein + Non-Starchy Vegetables  Eat 3-6 small meals/snacks, every 3-5 hrs  Increase lean protein foods to meet 60g goal  Increase fluid intake to 64oz +  Avoid drinking 15 minutes before, during and 30 minutes after eating  Aim for >30 min of physical activity daily  Call Dr. Redmond Pulling about diarrhea  Try eliminating dairy to see if diarrhea improves  Try to find a pattern with certain foods and whether they cause diarrhea  Eggs, fish, beef, [place] sausage, beans, chicken, [place], small amount of almonds, Quest protein bar   As you add dairy back, try low or non fat dairy products  Teaching Method Utilized:  Visual Auditory Hands on  Barriers to learning/adherence to lifestyle change: joint pain  Demonstrated degree of understanding via:  Teach Back   Monitoring/Evaluation:  Dietary intake, exercise, and body weight. Follow up in 1 months for 4 month post-op visit.

## 2015-09-06 ENCOUNTER — Other Ambulatory Visit: Payer: Self-pay | Admitting: *Deleted

## 2015-09-06 ENCOUNTER — Telehealth: Payer: Self-pay | Admitting: *Deleted

## 2015-09-06 DIAGNOSIS — Z853 Personal history of malignant neoplasm of breast: Secondary | ICD-10-CM

## 2015-09-06 DIAGNOSIS — R9389 Abnormal findings on diagnostic imaging of other specified body structures: Secondary | ICD-10-CM

## 2015-09-06 DIAGNOSIS — R918 Other nonspecific abnormal finding of lung field: Secondary | ICD-10-CM

## 2015-09-08 NOTE — Telephone Encounter (Signed)
No entry 

## 2015-09-09 ENCOUNTER — Telehealth: Payer: Self-pay | Admitting: Oncology

## 2015-09-09 NOTE — Telephone Encounter (Signed)
S/w with patient re f/u post ct per 2/8 pof. Per pt ct moved to [id number] due to conflicts, however patient expressed concern re why she needs the f/u as she was told by desk nurse that there would be no need for f/u right now. Patient will stop by after ct 2/13 to speak with desk nurse.

## 2015-09-11 ENCOUNTER — Ambulatory Visit (HOSPITAL_COMMUNITY)
Admission: RE | Admit: 2015-09-11 | Discharge: 2015-09-11 | Disposition: A | Discharge status: Home / Self Care | Payer: BLUE CROSS/BLUE SHIELD | Source: Ambulatory Visit | Attending: Oncology | Admitting: Oncology

## 2015-09-11 ENCOUNTER — Ambulatory Visit (HOSPITAL_BASED_OUTPATIENT_CLINIC_OR_DEPARTMENT_OTHER): Payer: BLUE CROSS/BLUE SHIELD | Admitting: Oncology

## 2015-09-11 ENCOUNTER — Other Ambulatory Visit: Payer: Self-pay

## 2015-09-11 ENCOUNTER — Other Ambulatory Visit: Payer: Self-pay | Admitting: Oncology

## 2015-09-11 ENCOUNTER — Encounter (HOSPITAL_COMMUNITY): Payer: Self-pay | Admitting: Radiology

## 2015-09-11 DIAGNOSIS — R938 Abnormal findings on diagnostic imaging of other specified body structures: Secondary | ICD-10-CM | POA: Diagnosis not present

## 2015-09-11 DIAGNOSIS — Z86000 Personal history of in-situ neoplasm of breast: Secondary | ICD-10-CM

## 2015-09-11 DIAGNOSIS — R918 Other nonspecific abnormal finding of lung field: Secondary | ICD-10-CM | POA: Diagnosis not present

## 2015-09-11 DIAGNOSIS — Z853 Personal history of malignant neoplasm of breast: Secondary | ICD-10-CM | POA: Insufficient documentation

## 2015-09-11 DIAGNOSIS — R9389 Abnormal findings on diagnostic imaging of other specified body structures: Secondary | ICD-10-CM

## 2015-09-11 DIAGNOSIS — D0510 Intraductal carcinoma in situ of unspecified breast: Secondary | ICD-10-CM

## 2015-09-11 MED ORDER — IOHEXOL 300 MG/ML  SOLN
75.0000 mL | Freq: Once | INTRAMUSCULAR | Status: AC | PRN
  Administered 2015-09-11: 75 mL via INTRAVENOUS

## 2015-09-11 NOTE — Progress Notes (Signed)
ID: Lindsey Keller   DOB: [birth year]  MR#: [id number]  [id number]  PCP: Lindsey Keller GYN:  SU: Lindsey Keller M.D. OTHER MD: Lindsey Keller   HISTORY OF PRESENT ILLNESS: From the original intake note:  The patient underwent screening mammography at [facility] in [place] on 09/06.  This showed a new nidus of microcalcifications in the left breast.  She returned for additional mammographic views on 09/13 which confirmed the presence of a cluster of microcalcifications which was not present on the previous studies. Accordingly, the patient underwent stereotactically guided core needle biopsy.  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  The tumor was tested for prognostic factors and found to be 100 percent estrogen receptor positive and 70 percent progesterone receptor positive.  She was subsequently seen by Lindsey Keller who performed a needle localized partial mastectomy on 05/08/04.  This specimen contained a small focus of residual DCIS measuring 1.5 mm.  Presumably, a large portion of the DCIS was removed by the Mammotome biopsy.  Within the lumpectomy specimen, the cluster of DCIS approached to within 8 mm of the closest surgical margin.  Her subsequent treatment is as detailed below.  INTERVAL HISTORY: Lindsey Keller returns today for followup of her noninvasive breast cancer. We had released [place] from follow-up here. However in the fall of 2016 she developed what proved to be repeat cholecystitis attacks. She was also evaluated for bariatric surgery, which she underwent 05/01/2015.  However in the run up to this surgery she had multiple scans and they showed some pulmonary nodules. Six-month follow-up was recommended. Accordingly she was set up for repeat CT of the chest earlier today and she is here to discuss results.  REVIEW OF SYSTEMS: Lindsey Keller did well with her bariatric surgery and has already lost 55  pounds. She has no pulmonary symptoms and particularly no cough phlegm production pleurisy hemoptysis or shortness of breath. She tolerated the bariatric surgery without any unusual complications. He detailed review of systems today was unremarkable  PAST MEDICAL HISTORY: Past Medical History  Diagnosis Date  . Depression   . Other psoriasis     involving hands only  . Obesity   . Glucose intolerance (pre-diabetes)   . Complication of anesthesia     INSOMNIA / NIGHTMARES AFTER HYSTERECTOMY X 6 MONTHS  . Asthma     INFREQUENT PROBLEM - NO INHALER CURRENTLY  . Arthritis   . DDD (degenerative disc disease), lumbar   . Spinal stenosis   . GERD (gastroesophageal reflux disease)   . Barrett's esophagus   . IBS (irritable bowel syndrome)   . Sleep - wake disorder   . 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 Diabetes mellitus without complication ([place])     pt lost 50pds not a DM anymore    PAST SURGICAL HISTORY: Past Surgical History  Procedure Laterality Date  . Pilonidal cyst excision    . Partial hysterectomy      no salpingo-oophorectomy  . Breast surgery  2005    LUMPECTOMY-CANCER  . Bunionectomy      L FOOT  . Wisdom tooth extraction    . Diagnostic laparoscopy      FOR ENDOMETRIOSIS  . Laparoscopic roux-en-y gastric bypass with hiatal hernia repair N/A 05/01/2015    Procedure: LAPAROSCOPIC ROUX-EN-Y GASTRIC BYPASS WITH HIATAL HERNIA REPAIR-POSSIBLE CHOLECYSTECTOMY;  Surgeon: Lindsey Pickerel, MD;  Location: [facility];  Service: General;  Laterality: N/A;  .  Cholecystectomy N/A 05/01/2015    Procedure: LAPAROSCOPIC CHOLECYSTECTOMY WITH INTRAOPERATIVE CHOLANGIOGRAM;  Surgeon: Lindsey Pickerel, MD;  Location: [facility];  Service: General;  Laterality: N/A;    FAMILY HISTORY The patient is adopted and does not know her biological family's medical history.    GYNECOLOGIC HISTORY: menarche at age 81. GX P0. She used oral contraceptives for 12 years until age 64.  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 Her husband of 39 years, "Lindsey Keller", is a retired Engineer, structural currently working for [employer]. Son Lindsey Keller is currently in college of an invasive "terrific baseball player, waiting to be drafted". Daughter Lindsey Keller is a Paramedic in [employer]. Both children are adopted. The patient is not a church attender.    ADVANCED DIRECTIVES: Not in place  HEALTH MAINTENANCE: Social History  Substance Use Topics  . Smoking status: Former Smoker    Quit date: 04/24/2013  . Smokeless tobacco: Not on file  . Alcohol Use: Yes     Comment: NONE SINCE JULY     Colonoscopy:  PAP:  Bone density: July 2013; normal  Lipid panel:  Allergies  Allergen Reactions  . Peach [Prunus Persica] Anaphylaxis and Itching    If not cooked  . Peanut-Containing Drug Products Anaphylaxis and Itching    Walnuts - if not cooked  . Adhesive [Tape] Itching  . Keflex [Cephalexin]     Body aches  . Pollen Extract Other (See Comments)    Seasonal Pollen- sneezing, red eyes  . Shellfish-Derived Products Other (See Comments)    " only eyes itch if touched"  . Bextra [Valdecoxib] Swelling and Rash    Facial swelling  . Diclofenac Nausea Only and Other (See Comments)    Dizziness, rash  . Latex Itching  . Nickel Itching and Rash    Current Outpatient Prescriptions  Medication Sig Dispense Refill  . acetaminophen (TYLENOL) 160 MG/5ML solution Take 10.2-20.3 mLs (325-650 mg total) by mouth every 4 (four) hours as needed for moderate pain (severe pain). 120 mL 0  . BIOTIN PO Take 1 tablet by mouth daily.    . calcium carbonate (OS-CAL - DOSED IN MG OF ELEMENTAL CALCIUM) 1250 MG tablet Take 1 tablet by mouth daily.      . cholecalciferol (VITAMIN D) 1000 UNITS tablet Take 1,000 Units by mouth  daily.      . Multiple Vitamin (MULTIVITAMIN WITH MINERALS) TABS tablet Take 1 tablet by mouth daily.    Marland Kitchen omeprazole (PRILOSEC) 40 MG capsule Take 40 mg by mouth 2 (two) times daily as needed (heartburn).     . ondansetron (ZOFRAN ODT) 4 MG disintegrating tablet Take 1 tablet (4 mg total) by mouth every 8 (eight) hours as needed for nausea or vomiting. 30 tablet 0  . oxyCODONE (ROXICODONE) 5 MG/5ML solution Take 5-10 mLs (5-10 mg total) by mouth every 4 (four) hours as needed for moderate pain or severe pain.  0  . venlafaxine (EFFEXOR) 37.5 MG tablet Take 0.5 tablets (18.75 mg total) by mouth 2 (two) times daily with a meal. 60 tablet 1  . vitamin B-12 (CYANOCOBALAMIN) 500 MCG tablet Take 500 mcg by mouth daily.     No current facility-administered medications for this visit.    OBJECTIVE: Middle-aged white woman in no acute distress There  were no vitals filed for this visit.   There is no weight on file to calculate BMI.    ECOG FS: 0  Sclerae unicteric, pupils round and equal Oropharynx clear and moist No cervical or supraclavicular adenopathy Lungs no rales or rhonchi Heart regular rate and rhythm Abd soft, obese, nontender, positive bowel sounds MSK no focal spinal tenderness, no upper extremity lymphedema Neuro: nonfocal, well oriented, appropriate affect Breasts: Deferred   LAB RESULTS: Lab Results  Component Value Date   WBC 9.3 05/03/2015   NEUTROABS 6.8 05/03/2015   HGB 12.7 05/03/2015   HCT 37.9 05/03/2015   MCV 99.0 05/03/2015   PLT 252 05/03/2015      Chemistry      Component Value Date/Time   NA 140 05/02/2015 0557   NA 140 07/25/2014 0915   K 4.1 05/02/2015 0557   K 4.4 07/25/2014 0915   CL 106 05/02/2015 0557   CL 103 07/20/2012 0902   CO2 28 05/02/2015 0557   CO2 28 07/25/2014 0915   BUN 9 05/02/2015 0557   BUN 13.8 07/25/2014 0915   CREATININE 0.80 05/02/2015 0557   CREATININE 0.9 07/25/2014 0915      Component Value Date/Time   CALCIUM 8.4*  05/02/2015 0557   CALCIUM 8.9 07/25/2014 0915   ALKPHOS 71 05/02/2015 0557   ALKPHOS 111 07/25/2014 0915   AST 44* 05/02/2015 0557   AST 22 07/25/2014 0915   ALT 41 05/02/2015 0557   ALT 29 07/25/2014 0915   BILITOT 0.6 05/02/2015 0557   BILITOT 0.53 07/25/2014 0915       Lab Results  Component Value Date   LABCA2 22 07/20/2012    No components found for: [id number]  No results for input(s): INR in the last 168 hours.  Urinalysis    Component Value Date/Time   COLORURINE YELLOW 04/27/2015 0552    STUDIES:Ct Chest W Contrast  09/11/2015  CLINICAL DATA:  Followup indeterminate pulmonary nodules seen on recent abdomen CT. Personal history of breast carcinoma. EXAM: CT CHEST WITH CONTRAST TECHNIQUE: Multidetector CT imaging of the chest was performed during intravenous contrast administration. CONTRAST:  72mL OMNIPAQUE IOHEXOL 300 MG/ML  SOLN COMPARISON:  CT abdomen on 04/27/2015 FINDINGS: Mediastinum/Lymph Nodes: No masses, pathologically enlarged lymph nodes, or other significant abnormality. 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 Lungs/Pleura: Sub-cm pulmonary nodules are seen in both lower lobes which remain stable since previous study. A 7 mm pulmonary nodule an adjacent 3 mm pulmonary nodule are seen in the right middle lobe adjacent to the minor fissure on images 20 6 and 27, which were above the field-of-view on previous study, but likely represent intrapulmonary lymph nodes. No evidence of pulmonary consolidation or pleural effusion. Upper abdomen: Previous gastric bypass surgery. Both adrenal glands are normal in appearance. Musculoskeletal: No chest wall mass or suspicious bone lesions identified. IMPRESSION: Previously seen bilateral lower lobe pulmonary nodules remain stable. Other nodules in right middle lobe likely represent intrapulmonary lymph nodes. Continued followup by CT recommended in 6 months. Electronically  Signed   By: Earle Gell M.D.   On: 09/11/2015 16:07     ASSESSMENT: 60 y.o. 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  (1) status post radiation to the entire left breast with a boost to the surgical cavity completed 07/24/2004  (2) enrolled in NSABP B-35, randomized to tamoxifen  versus anastrozole, continued for 5 years, completed December 2010.  (3) pulmonary nodules incidentally noted on CT scan September 2016  (a) CT scan of the chest 09/11/2015 shows the nodules to be stable   PLAN:  I discussed Sani situation with her at length. Very likely the nodules we are seeing in her lungs are scar tissue from remote infections. However 2 demonstrated will require another CT scan in 6 months from now and then a final CT scan of the chest 18 months from now.  I reassured her that these nodules are not going to be her breast cancer because her breast cancer was noninvasive. It would not be able to travel to any vital organs such as the lung.  She does not have a primary care physician. She has however being closely followed by Dr. Redmond Pulling right now because of her recent bariatric surgery.  I am setting her up for the CT scan of the chest in 6 months and a visit here a week later. However if she is going to be seeing Dr. Redmond Pulling at about the same time and is comfortable reading the CT scan with her that she can cancel that visit and see [place] again a year after that with the final CT scan.  Obviously the reason for this is the patient's increasing medical costs. She is very concerned about this because she is not working at this time.  The plus side, she has already lost 55 pounds after her bariatric surgery. She has also had no further gallbladder problems since her cholecystectomy.  She knows to call for any issues that may develop before her next visit  here.      Othello Dickenson C    09/11/2015

## 2015-09-13 ENCOUNTER — Ambulatory Visit: Payer: BLUE CROSS/BLUE SHIELD | Admitting: Nurse Practitioner

## 2015-09-13 ENCOUNTER — Telehealth: Payer: Self-pay | Admitting: Oncology

## 2015-09-13 NOTE — Telephone Encounter (Signed)
Per 2/13 pof lab/ct/fu 6 mos. Confirmed with desk nurse 2/20 f/u should be cxd. Left message for patient re lab/fu for August and cancelling 2/20 f/u. Central radiology scheduling will call patient re ct. Schedule mailed.

## 2015-09-14 ENCOUNTER — Encounter: Payer: BLUE CROSS/BLUE SHIELD | Attending: General Surgery | Admitting: Dietician

## 2015-09-14 ENCOUNTER — Encounter: Payer: Self-pay | Admitting: Dietician

## 2015-09-14 DIAGNOSIS — Z6841 Body Mass Index (BMI) 40.0 and over, adult: Secondary | ICD-10-CM | POA: Diagnosis not present

## 2015-09-14 DIAGNOSIS — Z713 Dietary counseling and surveillance: Secondary | ICD-10-CM | POA: Diagnosis not present

## 2015-09-14 NOTE — Patient Instructions (Addendum)
Goals:  Follow Phase 3B: High Protein + Non-Starchy Vegetables  Eat 3-6 small meals/snacks, every 3-5 hrs  Increase lean protein foods to meet 60g goal  Increase fluid intake to 64oz +  Avoid drinking 15 minutes before, during and 30 minutes after eating  Aim for >30 min of physical activity daily  Surgery date: 05/01/15 Surgery type: RYGB Start weight at [facility]: 259 lbs on 03/01/15 (261 lbs) Weight today: 206.5 lbs Weight change: 7 lbs Total weight lost: 54.5 lbs Weight loss goal: 140-165 lbs  TANITA  BODY COMP RESULTS  04/10/15 05/16/15 06/28/15 08/07/15 09/14/15   BMI (kg/m^2) N/A 43.2 41.3 37.8 36.6   Fat Mass (lbs)  131.5 113.5 102.5 90   Fat Free Mass (lbs)  112.5 119.5 111 116.5   Total Body Water (lbs)  82.5 87.5 81.5 85.5

## 2015-09-14 NOTE — Progress Notes (Signed)
  Follow-up visit:  4.5 months Post-Operative RYGB Surgery  Medical Nutrition Therapy:  Appt start time: 0225 end time:  320  Primary concerns today: Post-operative Bariatric Surgery Nutrition Management.  Lindsey Keller returns today having lost another 12 pounds of fat. Struggling with fluid retention and having ankle edema. Has been going to BELT 2x a week. Patient states that diarrhea has completely improved with Colestipol. She states that she drinks 48 ounces of water with lemon but urinates only 2x a day.    Surgery date: 05/01/15 Surgery type: RYGB Start weight at [facility]: 259 lbs on 03/01/15 (261 lbs) Weight today: 206.5 lbs Weight change: 7 lbs Total weight lost: 54.5 lbs Weight loss goal: 140-165 lbs  TANITA  BODY COMP RESULTS  04/10/15 05/16/15 06/28/15 08/07/15 09/14/15   BMI (kg/m^2) N/A 43.2 41.3 37.8 36.6   Fat Mass (lbs)  131.5 113.5 102.5 90   Fat Free Mass (lbs)  112.5 119.5 111 116.5   Total Body Water (lbs)  82.5 87.5 81.5 85.5    Preferred Learning Style:  No preference indicated   Learning Readiness:   Ready  24-hr recall: B (10:30 AM): 2 deviled eggs (12g) Snk (AM):   L (PM): tomato soup with protein powder (24g) Snk (PM):   D (6 PM): 3 oz chicken and spinach OR 2 meatballs with broccoli (21g) Snk (PM):  Fluid intake: 48 oz water, cranberry water, lemon water Estimated total protein intake: ~60 grams per day  Medications: see list Supplementation: taking, still sometimes forgets Calcium  Using straws: yes Drinking while eating: sometimes Hair loss: no Carbonated beverages: no N/V/D/C: heaving and vomiting phlegm rarely; soft bowel movements every 2-3 days Dumping syndrome: none  Recent physical activity: BELT 2x a week   Progress Towards Goal(s):  In progress.    Nutritional Diagnosis:  [place]-3.3 Overweight/obesity related to past poor dietary habits and physical inactivity as evidenced by patient w/ recent RYGB surgery following dietary guidelines for  continued weight loss.     Intervention:  Nutrition counseling provided.  Teaching Method Utilized:  Visual Auditory Hands on  Barriers to learning/adherence to lifestyle change: joint pain  Demonstrated degree of understanding via:  Teach Back   Monitoring/Evaluation:  Dietary intake, exercise, and body weight. Follow up in 3 months for 7 month post-op visit.

## 2015-09-18 ENCOUNTER — Ambulatory Visit: Payer: BLUE CROSS/BLUE SHIELD | Admitting: Nurse Practitioner

## 2015-10-03 ENCOUNTER — Encounter (INDEPENDENT_AMBULATORY_CARE_PROVIDER_SITE_OTHER): Payer: Self-pay

## 2015-10-03 ENCOUNTER — Encounter: Payer: Self-pay | Admitting: Family Medicine

## 2015-10-03 ENCOUNTER — Ambulatory Visit (INDEPENDENT_AMBULATORY_CARE_PROVIDER_SITE_OTHER): Payer: BLUE CROSS/BLUE SHIELD | Admitting: Family Medicine

## 2015-10-03 VITALS — BP 106/70 | HR 84 | Ht 63.0 in | Wt 202.0 lb

## 2015-10-03 DIAGNOSIS — M25512 Pain in left shoulder: Secondary | ICD-10-CM | POA: Diagnosis not present

## 2015-10-03 MED ORDER — METHYLPREDNISOLONE ACETATE 40 MG/ML IJ SUSP
40.0000 mg | Freq: Once | INTRAMUSCULAR | Status: AC
  Administered 2015-10-03: 40 mg via INTRA_ARTICULAR

## 2015-10-03 NOTE — Patient Instructions (Signed)
You have rotator cuff impingement Try to avoid painful activities (overhead activities, lifting with extended arm) as much as possible. Tylenol as needed for pain - consider something like norco for severe pain. Subacromial injection may be beneficial to help with pain and to decrease inflammation - you were given this today. Start physical therapy with transition to home exercise program. Do home exercise program with theraband and scapular stabilization exercises daily - these are very important for long term relief even if an injection was given.  3 sets of 10 once a day. If not improving at follow-up we will consider further imaging and/or nitro patches. Follow up with me in 6 weeks for reevaluation.

## 2015-10-05 DIAGNOSIS — K589 Irritable bowel syndrome without diarrhea: Secondary | ICD-10-CM | POA: Insufficient documentation

## 2015-10-05 DIAGNOSIS — M25512 Pain in left shoulder: Secondary | ICD-10-CM | POA: Insufficient documentation

## 2015-10-05 DIAGNOSIS — L409 Psoriasis, unspecified: Secondary | ICD-10-CM | POA: Insufficient documentation

## 2015-10-05 DIAGNOSIS — K279 Peptic ulcer, site unspecified, unspecified as acute or chronic, without hemorrhage or perforation: Secondary | ICD-10-CM | POA: Insufficient documentation

## 2015-10-05 NOTE — Assessment & Plan Note (Signed)
consistent with rotator cuff impingement.  Start physical therapy, home exercises.  Cannot take nsaids as s/p gastric bypass.  Subacromial injeciton given today.  F/u in 6 weeks for reevaluation.  Consider nitro patches, imaging if not improving.  After informed written consent, patient was seated on exam table. Left shoulder was prepped with alcohol swab and utilizing posterior approach, patient's left subacromial space was injected with 3:1 marcaine: depomedrol. Patient tolerated the procedure well without immediate complications.

## 2015-10-05 NOTE — Progress Notes (Signed)
PCP: [name], MD  Subjective:   HPI: Patient is a 60 y.o. female here for left shoulder pain.  Patient denies known injury or trauma. She states back in October 2016 she developed left shoulder pain after her gastric bypass surgery. Initially thought to be due to gas from surgery irritating diaphragm but this never improved. Pain level is 2/10 at rest, up to 8/10 at times with overhead motions especially. Difficulty lying on this shoulder, sleeping at night, sharp. No prior left shoulder issues. No swelling, bruising. No skin changes, fever, other complaints.  Past Medical History  Diagnosis Date  . Depression   . Other psoriasis     involving hands only  . Obesity   . Glucose intolerance (pre-diabetes)   . Complication of anesthesia     INSOMNIA / NIGHTMARES AFTER HYSTERECTOMY X 6 MONTHS  . Asthma     INFREQUENT PROBLEM - NO INHALER CURRENTLY  . Arthritis   . DDD (degenerative disc disease), lumbar   . Spinal stenosis   . GERD (gastroesophageal reflux disease)   . Barrett's esophagus   . IBS (irritable bowel syndrome)   . Sleep - wake disorder   . 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 Diabetes mellitus without complication ([place])     pt lost 50pds not a DM anymore    Current Outpatient Prescriptions on File Prior to Visit  Medication Sig Dispense Refill  . acetaminophen (TYLENOL) 160 MG/5ML solution Take 10.2-20.3 mLs (325-650 mg total) by mouth every 4 (four) hours as needed for moderate pain (severe pain). 120 mL 0  . BIOTIN PO Take 1 tablet by mouth daily.    . calcium carbonate (OS-CAL - DOSED IN MG OF ELEMENTAL CALCIUM) 1250 MG tablet Take 1 tablet by mouth daily.      . cholecalciferol (VITAMIN D) 1000 UNITS tablet Take 1,000 Units by mouth daily.      . Multiple Vitamin (MULTIVITAMIN WITH MINERALS) TABS tablet Take 1 tablet by mouth daily.    Marland Kitchen omeprazole (PRILOSEC) 40 MG capsule Take 40 mg by mouth 2 (two) times daily as  needed (heartburn).     . ondansetron (ZOFRAN ODT) 4 MG disintegrating tablet Take 1 tablet (4 mg total) by mouth every 8 (eight) hours as needed for nausea or vomiting. 30 tablet 0  . oxyCODONE (ROXICODONE) 5 MG/5ML solution Take 5-10 mLs (5-10 mg total) by mouth every 4 (four) hours as needed for moderate pain or severe pain.  0  . vitamin B-12 (CYANOCOBALAMIN) 500 MCG tablet Take 500 mcg by mouth daily.     No current facility-administered medications on file prior to visit.    Past Surgical History  Procedure Laterality Date  . Pilonidal cyst excision    . Partial hysterectomy      no salpingo-oophorectomy  . Breast surgery  2005    LUMPECTOMY-CANCER  . Bunionectomy      L FOOT  . Wisdom tooth extraction    . Diagnostic laparoscopy      FOR ENDOMETRIOSIS  . 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 Cholecystectomy N/A 05/01/2015    Procedure: LAPAROSCOPIC CHOLECYSTECTOMY WITH INTRAOPERATIVE CHOLANGIOGRAM;  Surgeon: [name], MD;  Location: [facility];  Service: General;  Laterality: N/A;    Allergies  Allergen Reactions  . Peach [Prunus Persica] Anaphylaxis and Itching    If not cooked  . Peanut-Containing Drug Products Anaphylaxis and Itching    Walnuts - if not cooked  . Adhesive [Tape] Itching  . Keflex [Cephalexin]     Body aches  . Nsaids   . Pollen Extract Other (See Comments)    Seasonal Pollen- sneezing, red eyes  . Shellfish-Derived Products Other (See Comments)    " only eyes itch if touched"  . Bextra [Valdecoxib] Swelling and Rash    Facial swelling  . Diclofenac Nausea Only and Other (See Comments)    Dizziness, rash  . Latex Itching  . Nickel Itching and Rash    Social History   Social History  . Marital Status: Married     Spouse Name: N/A  . Number of Children: N/A  . Years of Education: N/A   Occupational History  . Not on file.   Social History Main Topics  . Smoking status: Former Smoker    Quit date: 04/24/2013  . Smokeless tobacco: Not on file  . Alcohol Use: 0.0 oz/week    0 Standard drinks or equivalent per week     Comment: [place]  . Drug Use: No  . Sexual Activity: Not on file   Other Topics Concern  . Not on file   Social History Narrative    Family History  Problem Relation Age of Onset  . Adopted: Yes    BP 106/70 mmHg  Pulse 84  Ht 5\' 3"  (1.6 m)  Wt 202 lb (91.627 kg)  BMI 35.79 kg/m2  Review of Systems: See HPI above.    Objective:  Physical Exam:  Gen: NAD, comfortable in exam room  Left shoulder: No swelling, ecchymoses.  No gross deformity. No TTP. FROM with painful arc Positive Luan Pulling, Neers. Negative Speeds, Yergasons. Strength 5/5 with empty can and resisted internal/external rotation.  Pain with empty can. Negative apprehension. NV intact distally.  Right shoulder: FROM without pain.    Assessment & Plan:  1. Left shoulder pain - consistent with rotator cuff impingement.  Start physical therapy, home exercises.  Cannot take nsaids as s/p gastric bypass.  Subacromial injeciton given today.  F/u in 6 weeks for reevaluation.  Consider nitro patches, imaging if not improving.  After informed written consent, patient was seated on exam table. Left shoulder was prepped with alcohol swab and utilizing posterior approach, patient's left subacromial space was injected with 3:1 marcaine: depomedrol. Patient tolerated the procedure well without immediate complications.

## 2015-10-27 ENCOUNTER — Other Ambulatory Visit: Payer: Self-pay | Admitting: General Surgery

## 2015-10-27 DIAGNOSIS — Z9884 Bariatric surgery status: Secondary | ICD-10-CM

## 2015-11-01 ENCOUNTER — Encounter: Payer: Self-pay | Admitting: Gastroenterology

## 2015-11-07 ENCOUNTER — Other Ambulatory Visit: Payer: BLUE CROSS/BLUE SHIELD

## 2015-11-07 ENCOUNTER — Ambulatory Visit
Admission: RE | Admit: 2015-11-07 | Discharge: 2015-11-07 | Disposition: A | Discharge status: Home / Self Care | Payer: BLUE CROSS/BLUE SHIELD | Source: Ambulatory Visit | Attending: General Surgery | Admitting: General Surgery

## 2015-11-07 DIAGNOSIS — Z9884 Bariatric surgery status: Secondary | ICD-10-CM

## 2015-11-07 MED ORDER — IOPAMIDOL (ISOVUE-300) INJECTION 61%
100.0000 mL | Freq: Once | INTRAVENOUS | Status: AC | PRN
  Administered 2015-11-07: 100 mL via INTRAVENOUS

## 2015-11-10 ENCOUNTER — Ambulatory Visit: Payer: BLUE CROSS/BLUE SHIELD | Admitting: Family Medicine

## 2015-12-14 ENCOUNTER — Encounter: Payer: BLUE CROSS/BLUE SHIELD | Attending: General Surgery | Admitting: Dietician

## 2015-12-14 DIAGNOSIS — Z029 Encounter for administrative examinations, unspecified: Secondary | ICD-10-CM | POA: Diagnosis present

## 2015-12-14 NOTE — Progress Notes (Signed)
  Follow-up visit:  7.5 months Post-Operative RYGB Surgery  Medical Nutrition Therapy:  Appt start time: 410 end time:  455  Primary concerns today: Post-operative Bariatric Surgery Nutrition Management.  Lindsey Keller returns today having lost another 14 pounds. She reports feeling well but feels like she is struggling with a weight loss plateau. Recently went on a cruise and "ate everything" (fruit, dessert, bread) in small amounts and did not have any dumping or diarrhea. Gained 2 pounds on the trip and has since lost this weight. Continued to try to eat protein with each meal on vacation. No longer eating any dairy because she feels like she does not tolerate. She is back to having diarrhea every morning as soon as she wakes up (before she eats). She does not wake up in the night and eat; she has diarrhea in the morning regardless of what she eats for dinner the night before. Does not seem to be vitamin related; she takes her vitamins inconsistently. She reports that her bowels are "light tan, sandy colored." She is not having dizziness or sweatiness with diarrhea. However, she does have abdominal and leg cramps. Not meeting fluid needs. 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   Non scale victories: walking long distances, cheaper/cuter shoes, getting on and off the floor easily, no longer in plus sizes, feeling better  Surgery date: 05/01/15 Surgery type: RYGB Start weight at [facility]: 259 lbs on 03/01/15 (261 lbs) Weight today: 192.2 lbs Weight change: 14.3 lbs Total weight lost: 68.8 lbs Weight loss goal: 140-170 lbs  TANITA  BODY COMP RESULTS  04/10/15 05/16/15 06/28/15 08/07/15 09/14/15 12/14/15   BMI (kg/m^2) N/A 43.2 41.3 37.8 36.6 34   Fat Mass (lbs)  131.5 113.5 102.5 90 77   Fat Free Mass (lbs)  112.5 119.5 111 116.5 115.2   Total Body Water (lbs)  82.5 87.5 81.5 85.5 81.8    Preferred Learning Style:  No  preference indicated   Learning Readiness:   Ready  24-hr recall: B (9-9:30 AM): cheese stick (7g) Snk (AM):   L (PM): [place] and cheese on thin bread (14g) Snk (PM):   D (6 PM): meatloaf and green beans (21g) Snk (PM):  Fluid intake: at least 28 oz water Estimated total protein intake: 40-50 grams per day  Medications: none; Prevacid prn only Supplementation: inconsistent  Using straws: yes Drinking while eating: sometimes Hair loss: no Carbonated beverages: no N/V/D/C: vomits if she eats too much and diarrhea every morning Dumping syndrome: none  Recent physical activity: did not assess today  Progress Towards Goal(s):  In progress.    Nutritional Diagnosis:  [address]-3.3 Overweight/obesity related to past poor dietary habits and physical inactivity as evidenced by patient w/ recent RYGB surgery following dietary guidelines for continued weight loss.     Intervention:  Nutrition counseling provided.  Teaching Method Utilized:  Visual Auditory Hands on  Barriers to learning/adherence to lifestyle change: joint pain  Demonstrated degree of understanding via:  Teach Back   Monitoring/Evaluation:  Dietary intake, exercise, and body weight. Follow up in 3 months for 10 month post-op visit.

## 2015-12-14 NOTE — Patient Instructions (Addendum)
Goals:  Follow Phase 3B: High Protein + Non-Starchy Vegetables  Eat 3-6 small meals/snacks, every 3-5 hrs  Increase lean protein foods to meet 60g goal  Increase fluid intake to 64oz +  Avoid drinking 15 minutes before, during and 30 minutes after eating  Aim for >30 min of physical activity daily  Increase potassium-containing foods  Surgery type: RYGB [facility] weight at [facility]: 259 lbs on 03/01/15 (261 lbs) Weight today: 192.2 lbs Weight change: 14.3 lbs Total weight lost: 68.8 lbs Weight loss goal: 140-170 lbs  TANITA  BODY COMP RESULTS  04/10/15 05/16/15 06/28/15 08/07/15 09/14/15 12/14/15   BMI (kg/m^2) N/A 43.2 41.3 37.8 36.6 34   Fat Mass (lbs)  131.5 113.5 102.5 90 77   Fat Free Mass (lbs)  112.5 119.5 111 116.5 115.2   Total Body Water (lbs)  82.5 87.5 81.5 85.5 81.8    High-potassium foods:  Fruit: ?Avocado, 1/2 avocado has 487 mg ?Cantaloupe, 1 cup has 473 mg ?Honeydew, 1 cup has 404 mg ?Cherries, 1 cup has 306 mg  Vegetables  ?Zucchini,  cup cooked has 280 mg potassium ?Tomato, 1 medium has 292 mg ?Potato, 1/2 cup has 316 mg ?Sweet potato, 1/2 cup has 224 mg  Legumes  ?Soybeans,  cup cooked has 440 mg potassium ?Lentils,  cup cooked has 370 mg potassium ?Kidney beans,  cup cooked has 360 mg potassium ?White beans, canned, drained, half cup: 595 mg  Protein ?Yogurt, fat-free, 1 cup: 579 mg ?Halibut, 3 ounces, cooked: 490 mg ?Pork tenderloin, 3 ounces, cooked: 382 mg ?Milk, skim or soy, 8 ounces: 340-410 mg ?Salmon, [place], 3 ounces, cooked: 326 mg ?Chicken breast, 3 ounces, cooked: 218 mg ?Tuna, light, canned, drained, 3 ounces: 201 mg

## 2015-12-15 ENCOUNTER — Encounter: Payer: Self-pay | Admitting: Dietician

## 2016-01-10 ENCOUNTER — Ambulatory Visit (INDEPENDENT_AMBULATORY_CARE_PROVIDER_SITE_OTHER): Payer: BLUE CROSS/BLUE SHIELD | Admitting: Gastroenterology

## 2016-01-10 ENCOUNTER — Encounter: Payer: Self-pay | Admitting: Gastroenterology

## 2016-01-10 VITALS — BP 104/70 | HR 84 | Ht 62.5 in | Wt 187.1 lb

## 2016-01-10 DIAGNOSIS — F458 Other somatoform disorders: Secondary | ICD-10-CM

## 2016-01-10 DIAGNOSIS — R197 Diarrhea, unspecified: Secondary | ICD-10-CM

## 2016-01-10 DIAGNOSIS — R0989 Other specified symptoms and signs involving the circulatory and respiratory systems: Secondary | ICD-10-CM

## 2016-01-10 DIAGNOSIS — K219 Gastro-esophageal reflux disease without esophagitis: Secondary | ICD-10-CM | POA: Diagnosis not present

## 2016-01-10 DIAGNOSIS — R1310 Dysphagia, unspecified: Secondary | ICD-10-CM | POA: Diagnosis not present

## 2016-01-10 MED ORDER — RANITIDINE HCL 300 MG PO TABS: 300.0000 mg | ORAL_TABLET | Freq: Every day | ORAL | Status: DC

## 2016-01-10 NOTE — Patient Instructions (Addendum)
You have been scheduled for an esophageal manometry at [facility] on 01/31/2016 at 10:30am. Please arrive 30 minutes prior to your procedure for registration. You will need to go to outpatient registration (1st floor of the hospital) first. Make certain to bring your insurance cards as well as a complete list of medications.  Please remember the following:  1) Nothing to eat or drink after 12:00 midnight on the night before your test.  2) Hold all diabetic medications/insulin the morning of the test. You may eat and take             your medications after the test.  3) For 3 days prior to your test do not take: Dexilant, Prevacid, Nexium, Protonix,         Aciphex, Zegerid, Pantoprazole, Prilosec or omeprazole.  4) For 2 days prior to your test, do not take: Reglan, Tagamet, Zantac, Axid or Pepcid.  5) You MAY use an antacid such as Rolaids or Tums up to 12 hours prior to your test.  It will take at least 2 weeks to receive the results of this test from your physician. ------------------------------------------ ABOUT ESOPHAGEAL MANOMETRY Esophageal manometry (muh-NOM-uh-tree) is a test that gauges how well your esophagus works. Your esophagus is the long, muscular tube that connects your throat to your stomach. Esophageal manometry measures the rhythmic muscle contractions (peristalsis) that occur in your esophagus when you swallow. Esophageal manometry also measures the coordination and force exerted by the muscles of your esophagus.  During esophageal manometry, a thin, flexible tube (catheter) that contains sensors is passed through your nose, down your esophagus and into your stomach. Esophageal manometry can be helpful in diagnosing some mostly uncommon disorders that affect your esophagus.  Why it's done Esophageal manometry is used to evaluate the movement (motility) of food through the esophagus and into the stomach. The test measures how well the circular bands of muscle  (sphincters) at the top and bottom of your esophagus open and close, as well as the pressure, strength and pattern of the wave of esophageal muscle contractions that moves food along.  What you can expect Esophageal manometry is an outpatient procedure done without sedation. Most people tolerate it well. You may be asked to change into a hospital gown before the test starts.  During esophageal manometry  While you are sitting up, a member of your health care team sprays your throat with a numbing medication or puts numbing gel in your nose or both.  A catheter is guided through your nose into your esophagus. The catheter may be sheathed in a water-filled sleeve. It doesn't interfere with your breathing. However, your eyes may water, and you may gag. You may have a slight nosebleed from irritation.  After the catheter is in place, you may be asked to lie on your back on an exam table, or you may be asked to remain seated.  You then swallow small sips of water. As you do, a computer connected to the catheter records the pressure, strength and pattern of your esophageal muscle contractions.  During the test, you'll be asked to breathe slowly and smoothly, remain as still as possible, and swallow only when you're asked to do so.  A member of your health care team may move the catheter down into your stomach while the catheter continues its measurements.  The catheter then is slowly withdrawn. The test usually lasts 20 to 30 minutes.  After esophageal manometry  When your esophageal manometry is complete, you  may return to your normal activities  This test typically takes 30-45 minutes to complete. ________________________________________________________________________________  We have contacted [facility] to get your 2016 Endoscopy report Use VSL #3 112 BU daily Use Gaviscon as needed We have sent in Zantac to your pharmacy

## 2016-01-11 NOTE — Progress Notes (Signed)
GROVER ZOOK    [id number]    26-Aug-1955  Primary Care Physician:LEAGUE-SOBON, Anderson Malta, MD  Referring Physician: [name], MD [place], [place] [address]  Chief complaint:  Globus sensation, Dysphagia, diarrhea HPI: 60 year old female with history of obesity, GERD, Barrett's esophagus status post gastric bypass surgery here for evaluation with complaints of globus sensation since gastric bypass surgery. She also had diarrhea post surgery which has improved since she was started on cholestyramine . She usually has abdominal cramping followed by nausea and large bowel movement which relieves her symptoms in the morning. She had semi-formed bowel movement after every meal and that has improved with cholestyramine. Currently has only one or 2 bowel movements a day. Denies any dysphagia to liquids or solids but she does feel the food go down slow and feels this something stuck in her throat all the time. She also has been feeling more regurgitation and irritation in the throat for which she has been taking omeprazole as needed with some improvement. Denies any abdominal pain, melena or blood per rectum. The patient she had an EGD done by Dr. Redmond Pulling post surgery that was normal. Records are not available for review during this Visit.   Outpatient Encounter Prescriptions as of 01/10/2016  Medication Sig  . acetaminophen (TYLENOL) 160 MG/5ML solution Take 10.2-20.3 mLs (325-650 mg total) by mouth every 4 (four) hours as needed for moderate pain (severe pain).  Marland Kitchen BIOTIN PO Take 1 tablet by mouth daily.  . calcium carbonate (OS-CAL - DOSED IN MG OF ELEMENTAL CALCIUM) 1250 MG tablet Take 1 tablet by mouth daily.    . cholecalciferol (VITAMIN D) 1000 UNITS tablet Take 1,000 Units by mouth daily.    . colestipol (COLESTID) 1 g tablet TK 1 T PO  AS NEEDED  . loperamide (IMODIUM) 2 MG capsule Take by mouth as needed for diarrhea or loose stools.  . Multiple Vitamin  (MULTIVITAMIN WITH MINERALS) TABS tablet Take 1 tablet by mouth daily.  Marland Kitchen omeprazole (PRILOSEC) 40 MG capsule Take 40 mg by mouth as needed (heartburn).   . vitamin B-12 (CYANOCOBALAMIN) 500 MCG tablet Take 500 mcg by mouth daily.  . ranitidine (ZANTAC) 300 MG tablet Take 1 tablet (300 mg total) by mouth at bedtime.  . [DISCONTINUED] ondansetron (ZOFRAN ODT) 4 MG disintegrating tablet Take 1 tablet (4 mg total) by mouth every 8 (eight) hours as needed for nausea or vomiting.  . [DISCONTINUED] oxyCODONE (ROXICODONE) 5 MG/5ML solution Take 5-10 mLs (5-10 mg total) by mouth every 4 (four) hours as needed for moderate pain or severe pain.  . [DISCONTINUED] venlafaxine XR (EFFEXOR-XR) 37.5 MG 24 hr capsule Take 37.5 mg by mouth.   No facility-administered encounter medications on file as of 01/10/2016.    Allergies as of 01/10/2016 - Review Complete 01/10/2016  Allergen Reaction Noted  . Peach [prunus persica] Anaphylaxis and Itching 04/11/2015  . Peanut-containing drug products Anaphylaxis and Itching 04/11/2015  . Adhesive [tape] Itching 03/11/2014  . Keflex [cephalexin]  03/11/2014  . Nsaids  10/03/2015  . Pollen extract Other (See Comments) 04/11/2015  . Shellfish-derived products Other (See Comments) 04/11/2015  . Bextra [valdecoxib] Swelling and Rash 03/11/2014  . Diclofenac Nausea Only and Other (See Comments) 04/11/2015  . Latex Itching 03/11/2014  . Nickel Itching and Rash 04/11/2015    Past Medical History  Diagnosis Date  . Depression   . Other psoriasis     involving hands only  .  Obesity   . Glucose intolerance (pre-diabetes)   . Complication of anesthesia     INSOMNIA / NIGHTMARES AFTER HYSTERECTOMY X 6 MONTHS  . Asthma     INFREQUENT PROBLEM - NO INHALER CURRENTLY  . Arthritis   . DDD (degenerative disc disease), lumbar   . Spinal stenosis   . GERD (gastroesophageal reflux disease)   . Barrett's esophagus   . IBS (irritable bowel syndrome)   . Sleep - wake disorder    . Breast cancer ([place])     BREAST CANCER - L LUMPECTOMY - RADIATION  . Diabetes mellitus without complication ([place])     pt lost 50pds not a DM anymore  . Status post dilation of esophageal narrowing     Past Surgical History  Procedure Laterality Date  . Pilonidal cyst excision    . Partial hysterectomy      no salpingo-oophorectomy  . Breast lumpectomy  2005    LUMPECTOMY-CANCER  . Bunionectomy Left     L FOOT  . Wisdom tooth extraction    . Diagnostic laparoscopy      FOR ENDOMETRIOSIS  . 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 Cholecystectomy N/A 05/01/2015    Procedure: LAPAROSCOPIC CHOLECYSTECTOMY WITH INTRAOPERATIVE CHOLANGIOGRAM;  Surgeon: [name], MD;  Location: [facility];  Service: General;  Laterality: N/A;    Family History  Problem Relation Age of Onset  . Adopted: Yes    Social History   Social History  . Marital Status: Married    Spouse Name: N/A  . Number of Children: 2  . Years of Education: N/A   Occupational History  . teacher    Social History Main Topics  . Smoking status: Former Smoker    Quit date: 04/24/2013  . Smokeless tobacco: Never Used  . Alcohol Use: 0.0 oz/week    0 Standard drinks or equivalent per week     Comment: [place]  . Drug Use: No  . Sexual Activity: Not on file   Other Topics Concern  . Not on file   Social History Narrative      Review of systems: Review of Systems  Constitutional: Negative for fever and chills.  HENT: Negative.   Eyes: Negative for blurred vision.  Respiratory: Negative for cough, shortness of breath and wheezing.   Cardiovascular: Negative for chest pain and palpitations.  Gastrointestinal: as per HPI Genitourinary: Negative for dysuria, urgency, frequency  and hematuria.  Musculoskeletal: Negative for myalgias, back pain and joint pain.  Skin: Negative for itching and rash.  Neurological: Negative for dizziness, tremors, focal weakness, seizures and loss of consciousness.  Endo/Heme/Allergies: Negative for environmental allergies.  Psychiatric/Behavioral: Negative for depression, suicidal ideas and hallucinations.  All other systems reviewed and are negative.   Physical Exam: Filed Vitals:   01/10/16 1433  BP: 104/70  Pulse: 84   Gen:      No acute distress HEENT:  EOMI, sclera anicteric Neck:     No masses; no thyromegaly Lungs:    Clear to auscultation bilaterally; normal respiratory effort CV:         Regular rate and rhythm; no murmurs Abd:      + bowel sounds; soft, non-tender; no palpable masses, no distension Ext:    No edema; adequate peripheral perfusion Skin:  Warm and dry; no rash Neuro: alert and oriented x 3 Psych: normal mood and affect  Data Reviewed:Reviewed chart in epic  Assessment and Plan/Recommendations:  60 year old female status post gastric bypass surgery in October 2016 with complaints of globus sensation and intermittent dysphagia We'll request records from Dr. Redmond Pulling and also [facility] to review past GI workup Schedule for esophageal manometry for evaluation of globus sensation and dysphagia Advise patient to stop taking PPI Can take H2 blocker, ranitidine as needed and okay to use Gaviscon as needed Diarrhea has improved with cholestyramine  Start probiotic VSL #3 112 B units daily Return after the procedure   Damaris Hippo , MD [phone] Mon-Fri 8a-5p [phone] after 5p, weekends, holidays  CC: [name],*

## 2016-01-17 ENCOUNTER — Encounter (HOSPITAL_COMMUNITY): Payer: Self-pay

## 2016-01-17 ENCOUNTER — Ambulatory Visit (HOSPITAL_COMMUNITY): Admit: 2016-01-17 | Payer: Self-pay | Admitting: Gastroenterology

## 2016-01-17 SURGERY — MANOMETRY, ESOPHAGUS

## 2016-01-29 ENCOUNTER — Telehealth: Payer: Self-pay | Admitting: Gastroenterology

## 2016-01-29 NOTE — Telephone Encounter (Signed)
She rescheduled once and has now cancelled. She does not have a follow up appointment.

## 2016-01-31 ENCOUNTER — Ambulatory Visit (HOSPITAL_COMMUNITY)
Admission: RE | Admit: 2016-01-31 | Payer: BLUE CROSS/BLUE SHIELD | Source: Ambulatory Visit | Admitting: Gastroenterology

## 2016-01-31 ENCOUNTER — Encounter (HOSPITAL_COMMUNITY): Admission: RE | Payer: Self-pay | Source: Ambulatory Visit

## 2016-01-31 SURGERY — MANOMETRY, ESOPHAGUS

## 2016-02-05 NOTE — Telephone Encounter (Signed)
Spoke with the patient. She will have new insurance after September. She would like to schedule the procedurethen and follow up as previously planned. She will be working with the school. She is requesting an appointment after 3:30pm.

## 2016-02-05 NOTE — Telephone Encounter (Signed)
Please schedule office visit next available to discuss if we can do any alternatives. thanks

## 2016-02-12 NOTE — Telephone Encounter (Signed)
Left her a message to call back. The October schedule is out. 3:00 pm is the latest appointment available.

## 2016-02-27 NOTE — Telephone Encounter (Signed)
Appointment for 05/08/16 at 3:30 scheduled for the patient.

## 2016-03-04 ENCOUNTER — Other Ambulatory Visit: Payer: Self-pay | Admitting: Oncology

## 2016-03-04 ENCOUNTER — Ambulatory Visit (HOSPITAL_BASED_OUTPATIENT_CLINIC_OR_DEPARTMENT_OTHER)
Admission: RE | Admit: 2016-03-04 | Discharge: 2016-03-04 | Disposition: A | Discharge status: Home / Self Care | Payer: BLUE CROSS/BLUE SHIELD | Source: Ambulatory Visit | Attending: Family Medicine | Admitting: Family Medicine

## 2016-03-04 DIAGNOSIS — Z1231 Encounter for screening mammogram for malignant neoplasm of breast: Secondary | ICD-10-CM | POA: Diagnosis present

## 2016-03-08 ENCOUNTER — Other Ambulatory Visit: Payer: Self-pay | Admitting: *Deleted

## 2016-03-08 DIAGNOSIS — D0510 Intraductal carcinoma in situ of unspecified breast: Secondary | ICD-10-CM

## 2016-03-11 ENCOUNTER — Other Ambulatory Visit (HOSPITAL_BASED_OUTPATIENT_CLINIC_OR_DEPARTMENT_OTHER): Payer: BLUE CROSS/BLUE SHIELD

## 2016-03-11 DIAGNOSIS — Z86000 Personal history of in-situ neoplasm of breast: Secondary | ICD-10-CM | POA: Diagnosis not present

## 2016-03-11 DIAGNOSIS — D0510 Intraductal carcinoma in situ of unspecified breast: Secondary | ICD-10-CM

## 2016-03-11 LAB — COMPREHENSIVE METABOLIC PANEL
ALK PHOS: 128 U/L (ref 40–150)
ALT: 21 U/L (ref 0–55)
ANION GAP: 9 meq/L (ref 3–11)
AST: 20 U/L (ref 5–34)
Albumin: 3.5 g/dL (ref 3.5–5.0)
BUN: 10.3 mg/dL (ref 7.0–26.0)
CALCIUM: 9.6 mg/dL (ref 8.4–10.4)
CO2: 26 mEq/L (ref 22–29)
Chloride: 108 mEq/L (ref 98–109)
Creatinine: 0.8 mg/dL (ref 0.6–1.1)
EGFR: 78 mL/min/{1.73_m2} — AB (ref >90)
Glucose: 99 mg/dl (ref 70–140)
Potassium: 4.3 mEq/L (ref 3.5–5.1)
SODIUM: 143 meq/L (ref 136–145)
Total Bilirubin: 0.62 mg/dL (ref 0.20–1.20)
Total Protein: 7 g/dL (ref 6.4–8.3)

## 2016-03-11 LAB — CBC WITH DIFFERENTIAL/PLATELET
BASO%: 0.6 % (ref 0.0–2.0)
Basophils Absolute: 0 10*3/uL (ref 0.0–0.1)
EOS%: 4.5 % (ref 0.0–7.0)
Eosinophils Absolute: 0.2 10*3/uL (ref 0.0–0.5)
HCT: 49.1 % — ABNORMAL HIGH (ref 34.8–46.6)
HGB: 16.8 g/dL — ABNORMAL HIGH (ref 11.6–15.9)
LYMPH%: 38 % (ref 14.0–49.7)
MCH: 33.8 pg (ref 25.1–34.0)
MCHC: 34.2 g/dL (ref 31.5–36.0)
MCV: 98.8 fL (ref 79.5–101.0)
MONO#: 0.3 10*3/uL (ref 0.1–0.9)
MONO%: 6.1 % (ref 0.0–14.0)
NEUT#: 2.6 10*3/uL (ref 1.5–6.5)
NEUT%: 50.8 % (ref 38.4–76.8)
PLATELETS: 249 10*3/uL (ref 145–400)
RBC: 4.97 10*6/uL (ref 3.70–5.45)
RDW: 13.3 % (ref 11.2–14.5)
WBC: 5.1 10*3/uL (ref 3.9–10.3)
lymph#: 1.9 10*3/uL (ref 0.9–3.3)

## 2016-03-18 ENCOUNTER — Ambulatory Visit: Payer: BLUE CROSS/BLUE SHIELD | Admitting: Oncology

## 2016-03-18 ENCOUNTER — Other Ambulatory Visit: Payer: BLUE CROSS/BLUE SHIELD

## 2016-03-18 ENCOUNTER — Ambulatory Visit: Payer: BLUE CROSS/BLUE SHIELD | Admitting: Dietician

## 2016-04-03 ENCOUNTER — Ambulatory Visit (HOSPITAL_COMMUNITY)
Admission: RE | Admit: 2016-04-03 | Discharge: 2016-04-03 | Disposition: A | Discharge status: Home / Self Care | Payer: BLUE CROSS/BLUE SHIELD | Source: Ambulatory Visit | Attending: Oncology | Admitting: Oncology

## 2016-04-03 DIAGNOSIS — R918 Other nonspecific abnormal finding of lung field: Secondary | ICD-10-CM | POA: Diagnosis present

## 2016-04-03 DIAGNOSIS — D0512 Intraductal carcinoma in situ of left breast: Secondary | ICD-10-CM | POA: Diagnosis present

## 2016-04-03 MED ORDER — IOPAMIDOL (ISOVUE-300) INJECTION 61%
75.0000 mL | Freq: Once | INTRAVENOUS | Status: AC | PRN
  Administered 2016-04-03: 75 mL via INTRAVENOUS

## 2016-04-07 NOTE — Progress Notes (Signed)
ID: Lindsey Keller   DOB: Jun 16, 1956  MR#: [id number]  [id number]  PCP: Miguel Aschoff GYN:  SU: [name] M.D. OTHER MD: Tyler Pita   HISTORY OF PRESENT ILLNESS: From the original intake note:  The patient underwent screening mammography at [facility] in [place] on 09/06.  This showed a new nidus of microcalcifications in the left breast.  She returned for additional mammographic views on 09/13 which confirmed the presence of a cluster of microcalcifications which was not present on the previous studies. Accordingly, the patient underwent stereotactically guided core needle biopsy.  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  The tumor was tested for prognostic factors and found to be 100 percent estrogen receptor positive and 70 percent progesterone receptor positive.  She was subsequently seen by Marylene Buerger who performed a needle localized partial mastectomy on 05/08/04.  This specimen contained a small focus of residual DCIS measuring 1.5 mm.  Presumably, a large portion of the DCIS was removed by the Mammotome biopsy.  Within the lumpectomy specimen, the cluster of DCIS approached to within 8 mm of the closest surgical margin.  Her subsequent treatment is as detailed below.  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 We are following also her multiple small pulmonary nodules, which we think may be secondary to remote scars. She just had a repeat CT chest, copy below, which shows these thoughts to be stable  REVIEW OF SYSTEMS: Lindsey Keller continues to lose weight from her bariatric surgery and is not far from her goal of 160 pounds. She has a good diet but is not exercising regularly. She tells me she has developed milk intolerance. A detailed review of systems today was otherwise stable  PAST MEDICAL HISTORY: Past Medical History:  Diagnosis Date  .  Arthritis   . Asthma    INFREQUENT PROBLEM - NO INHALER CURRENTLY  . Barrett's esophagus   . Breast cancer ([place])    BREAST CANCER - L LUMPECTOMY - RADIATION  . Complication of anesthesia    INSOMNIA / NIGHTMARES AFTER HYSTERECTOMY X 6 MONTHS  . DDD (degenerative disc disease), lumbar   . Depression   . Diabetes mellitus without complication ([place])    pt lost 50pds not a DM anymore  . GERD (gastroesophageal reflux disease)   . Glucose intolerance (pre-diabetes)   . IBS (irritable bowel syndrome)   . Obesity   . Other psoriasis    involving hands only  . Sleep - wake disorder   . Spinal stenosis   . Status post dilation of esophageal narrowing     PAST SURGICAL HISTORY: Past Surgical History:  Procedure Laterality Date  . BREAST LUMPECTOMY  2005   LUMPECTOMY-CANCER  . BUNIONECTOMY Left    L FOOT  . CHOLECYSTECTOMY N/A 05/01/2015   Procedure: LAPAROSCOPIC CHOLECYSTECTOMY WITH INTRAOPERATIVE CHOLANGIOGRAM;  Surgeon: [name], MD;  Location: [facility];  Service: General;  Laterality: N/A;  . DIAGNOSTIC LAPAROSCOPY     FOR ENDOMETRIOSIS  . LAPAROSCOPIC ROUX-EN-Y GASTRIC BYPASS WITH HIATAL HERNIA REPAIR N/A 05/01/2015   Procedure: LAPAROSCOPIC ROUX-EN-Y GASTRIC BYPASS WITH HIATAL HERNIA REPAIR-POSSIBLE CHOLECYSTECTOMY;  Surgeon: [name], MD;  Location: [facility];  Service: General;  Laterality: N/A;  . PARTIAL HYSTERECTOMY     no salpingo-oophorectomy  . PILONIDAL CYST EXCISION    . WISDOM TOOTH EXTRACTION      FAMILY HISTORY The patient is adopted and does not know her biological  family's medical history.    GYNECOLOGIC HISTORY: menarche at age 53. GX P0. She used oral contraceptives for 12 years until age 89.  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 Her husband of 21 years, "Theadore Nan", is  a retired Engineer, structural currently working for [employer]. Son Lennette Bihari is currently in college of an invasive "terrific baseball player, waiting to be drafted". Daughter Jinny Blossom is a Paramedic in [employer]. Both children are adopted. The patient is not a church attender.    ADVANCED DIRECTIVES: Not in place  HEALTH MAINTENANCE: Social History  Substance Use Topics  . Smoking status: Former Smoker    Quit date: 04/24/2013  . Smokeless tobacco: Never Used  . Alcohol use 0.0 oz/week     Comment: NONE SINCE JULY     Colonoscopy:  PAP:  Bone density: July 2013; normal  Lipid panel:  Allergies  Allergen Reactions  . Peach [Prunus Persica] Anaphylaxis and Itching    If not cooked  . Peanut-Containing Drug Products Anaphylaxis and Itching    Walnuts - if not cooked  . Adhesive [Tape] Itching  . Keflex [Cephalexin]     Body aches  . Nsaids   . Pollen Extract Other (See Comments)    Seasonal Pollen- sneezing, red eyes  . Shellfish-Derived Products Other (See Comments)    " only eyes itch if touched"  . Bextra [Valdecoxib] Swelling and Rash    Facial swelling  . Diclofenac Nausea Only and Other (See Comments)    Dizziness, rash  . Latex Itching  . Nickel Itching and Rash    Current Outpatient Prescriptions  Medication Sig Dispense Refill  . acetaminophen (TYLENOL) 160 MG/5ML solution Take 10.2-20.3 mLs (325-650 mg total) by mouth every 4 (four) hours as needed for moderate pain (severe pain). 120 mL 0  . BIOTIN PO Take 1 tablet by mouth daily.    . calcium carbonate (OS-CAL - DOSED IN MG OF ELEMENTAL CALCIUM) 1250 MG tablet Take 1 tablet by mouth daily.      . cholecalciferol (VITAMIN D) 1000 UNITS tablet Take 1,000 Units by mouth daily.      . colestipol (COLESTID) 1 g tablet TK 1 T PO  AS NEEDED  1  . loperamide (IMODIUM) 2 MG capsule Take by mouth as needed for diarrhea or loose stools.    . Multiple Vitamin (MULTIVITAMIN WITH MINERALS) TABS tablet Take 1 tablet by mouth  daily.    Marland Kitchen omeprazole (PRILOSEC) 40 MG capsule Take 40 mg by mouth as needed (heartburn).     . ranitidine (ZANTAC) 300 MG tablet Take 1 tablet (300 mg total) by mouth at bedtime. 30 tablet 3  . vitamin B-12 (CYANOCOBALAMIN) 500 MCG tablet Take 500 mcg by mouth daily.     No current facility-administered medications for this visit.     OBJECTIVE: Middle-aged white womanWho appears stated age 60:   04/08/16 1651  BP: 122/73  Pulse: 68  Resp: 18  Temp: 98.4 F (36.9 Keller)     Body mass index is 31.77 kg/m.    ECOG FS: 0  Sclerae unicteric, EOMs intact Oropharynx clear and moist No cervical or supraclavicular adenopathy Lungs no rales or rhonchi Heart regular rate and rhythm Abd soft, nontender, positive bowel sounds MSK no focal spinal tenderness, no  upper extremity lymphedema Neuro: nonfocal, well oriented, appropriate affect Breasts: Deferred    LAB RESULTS: Lab Results  Component Value Date   WBC 5.1 03/11/2016   NEUTROABS 2.6 03/11/2016   HGB 16.8 (H) 03/11/2016   HCT 49.1 (H) 03/11/2016   MCV 98.8 03/11/2016   PLT 249 03/11/2016      Chemistry      Component Value Date/Time   NA 143 03/11/2016 0912   K 4.3 03/11/2016 0912   CL 106 05/02/2015 0557   CL 103 07/20/2012 0902   CO2 26 03/11/2016 0912   BUN 10.3 03/11/2016 0912   CREATININE 0.8 03/11/2016 0912      Component Value Date/Time   CALCIUM 9.6 03/11/2016 0912   ALKPHOS 128 03/11/2016 0912   AST 20 03/11/2016 0912   ALT 21 03/11/2016 0912   BILITOT 0.62 03/11/2016 0912       Lab Results  Component Value Date   LABCA2 22 07/20/2012    No components found for: [id number]  No results for input(s): INR in the last 168 hours.  Urinalysis    Component Value Date/Time   COLORURINE YELLOW 04/27/2015 0552    STUDIES:Ct Chest W Contrast  Result Date: 04/03/2016 CLINICAL DATA:  DCIS LEFT breast, lung nodules on prior scan, type II diabetes mellitus, Barrett's esophagus/GERD EXAM: CT CHEST  WITH CONTRAST TECHNIQUE: Multidetector CT imaging of the chest was performed during intravenous contrast administration. Sagittal and coronal MPR images reconstructed from axial data set. 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 Aorta normal caliber. Thoracic vascular structures grossly patent on nondedicated exam. No pericardial effusion. Mediastinum/Nodes: Normal sized mediastinal lymph nodes. No thoracic adenopathy. Surgical clips LEFT breast. Lungs/Pleura: Tiny nodules scattered throughout both lungs. Many of these are calcified compatible granulomata. 5 mm noncalcified RIGHT lower lobe nodule image 80 stable. 7 mm nodular density at minor fissure image 65 stable. No new or enlarging pulmonary nodules identified. Small focus of infiltrate or atelectasis in anterior LEFT upper lobe images 49-53. No additional infiltrate, pleural effusion or pneumothorax. Upper Abdomen: Prior gastric bypass procedure. Gallbladder surgically absent. Remaining visualized upper abdomen unremarkable. Musculoskeletal: Degenerative disc disease changes at thoracolumbar junction. No acute osseous findings. IMPRESSION: Stable pulmonary nodules which represent a combination of calcified granulomata and stable noncalcified nodules. Followup CT in 12-18 months is considered optional for low-risk patients, but is recommended for high-risk patients. This recommendation follows the consensus statement: Guidelines for Management of Incidental Pulmonary Nodules Detected on CT Images:From the Fleischner Society 2017; published online before print (10.1148/radiol.IJ:2314499). Electronically Signed   By: Lavonia Dana M.D.   On: 04/03/2016 19:44   Study Result   CLINICAL DATA:  Screening.  EXAM: DIGITAL SCREENING BILATERAL  MAMMOGRAM WITH CAD  COMPARISON:  Previous exam(s).  ACR Breast Density Category b: There are scattered areas of fibroglandular density.  FINDINGS: There are no findings suspicious for malignancy. Images were processed with CAD.  IMPRESSION: No mammographic evidence of malignancy. A result letter of this screening mammogram will be mailed directly to the patient.  RECOMMENDATION: Screening mammogram in one year. (Code:[id number]-B-01Y)  BI-RADS CATEGORY  1: Negative.   Electronically Signed   By: Everlean Alstrom M.D.   On: 03/05/2016 13:51      ASSESSMENT: 60 y.o. status post left breast excisional biopsy 05/08/2004 for high-grade ductal carcinoma in situ measuring 1.5  mm, with ample margins, strongly estrogen and progesterone receptor positive.  (1) status post radiation to the entire left breast with a boost to the surgical cavity completed 07/24/2004  (2) enrolled in NSABP B-35, randomized to tamoxifen versus anastrozole, continued for 5 years, completed December 2010.  (3) pulmonary nodules incidentally noted on CT scan September 2016  (a) CT scan of the chest 09/11/2015 shows the nodules to be stable  (b)  repeat chest CT scan 04/03/2016 shows no change   PLAN:  Coua is doing very well as far as her remote breast cancer is concerned and also regarding her pulmonary nodules.  We need to repeat her CT of the chest one more time, a year from now or so, to complete the recommended follow-up routine. She is agreeable to this.  Accordingly she will see me again in August 2018. She will have a CT scan of the chest prior to that visit. She also of course will have mammography around that time and lab work.  Unfortunately she has still not been able to establish yourself with a primary care strongly urged her to do that before she sees me at the next visit, since I will probably release her from follow-up here at that time  She knows to call for any other issues that  may develop before then.     Lindsey Keller    04/08/2016

## 2016-04-08 ENCOUNTER — Ambulatory Visit (HOSPITAL_BASED_OUTPATIENT_CLINIC_OR_DEPARTMENT_OTHER): Payer: BLUE CROSS/BLUE SHIELD | Admitting: Oncology

## 2016-04-08 VITALS — BP 122/73 | HR 68 | Temp 98.4°F | Resp 18 | Ht 62.5 in | Wt 176.5 lb

## 2016-04-08 DIAGNOSIS — R911 Solitary pulmonary nodule: Secondary | ICD-10-CM | POA: Diagnosis not present

## 2016-04-08 DIAGNOSIS — Z86000 Personal history of in-situ neoplasm of breast: Secondary | ICD-10-CM | POA: Diagnosis not present

## 2016-04-08 DIAGNOSIS — D0512 Intraductal carcinoma in situ of left breast: Secondary | ICD-10-CM

## 2016-04-17 ENCOUNTER — Encounter: Payer: BLUE CROSS/BLUE SHIELD | Attending: General Surgery | Admitting: Dietician

## 2016-04-17 DIAGNOSIS — Z029 Encounter for administrative examinations, unspecified: Secondary | ICD-10-CM | POA: Diagnosis present

## 2016-04-17 NOTE — Patient Instructions (Addendum)
Goals:  Follow Phase 3B: High Protein + Non-Starchy Vegetables  Eat 3-6 small meals/snacks, every 3-5 hrs  Increase lean protein foods to meet 60g goal  Increase fluid intake to 64oz +  Propel or Powerade Zero  Avoid drinking 15 minutes before, during and 30 minutes after eating  Aim for >30 min of physical activity daily  Try Muscle Milk 100-calorie  Increase potassium-containing foods  Surgery date: 05/01/15 Surgery type: RYGB Start weight at [facility]: 259 lbs on 03/01/15 (261 lbs) Weight today: 175 lbs Weight change: 17.2 lbs Total weight lost: 86 lbs  TANITA  BODY COMP RESULTS  04/10/15 05/16/15 06/28/15 08/07/15 09/14/15 12/14/15 04/17/16   BMI (kg/m^2) N/A 43.2 41.3 37.8 36.6 34 31   Fat Mass (lbs)  131.5 113.5 102.5 90 77 65.8   Fat Free Mass (lbs)  112.5 119.5 111 116.5 115.2 109.2   Total Body Water (lbs)  82.5 87.5 81.5 85.5 81.8 77     High-potassium foods:  Fruit: ?Avocado, 1/2 avocado has 487 mg ?Cantaloupe, 1 cup has 473 mg ?Honeydew, 1 cup has 404 mg ?Cherries, 1 cup has 306 mg  Vegetables  ?Zucchini,  cup cooked has 280 mg potassium ?Tomato, 1 medium has 292 mg ?Potato, 1/2 cup has 316 mg ?Sweet potato, 1/2 cup has 224 mg  Legumes  ?Soybeans,  cup cooked has 440 mg potassium ?Lentils,  cup cooked has 370 mg potassium ?Kidney beans,  cup cooked has 360 mg potassium ?White beans, canned, drained, half cup: 595 mg  Protein ?Yogurt, fat-free, 1 cup: 579 mg ?Halibut, 3 ounces, cooked: 490 mg ?Pork tenderloin, 3 ounces, cooked: 382 mg ?Milk, skim or soy, 8 ounces: 340-410 mg ?Salmon, [place], 3 ounces, cooked: 326 mg ?Chicken breast, 3 ounces, cooked: 218 mg ?Tuna, light, canned, drained, 3 ounces: 201 mg

## 2016-04-17 NOTE — Progress Notes (Signed)
  Follow-up visit:  11.5 months Post-Operative RYGB Surgery  Medical Nutrition Therapy:  Appt start time: 415 end time:  455  Primary concerns today: Post-operative Bariatric Surgery Nutrition Management.  Lindsey Keller returns today having lost another 17 pounds over the summer despite travelling a lot. Taking an Immodium daily. Has noticed that her hunger has increased significantly in the past few weeks. Weighs herself daily and feels like this helps hold her accountable. Does not tolerate protein shakes and feels like sometimes she does not meet her protein needs.   Non scale victories: walking long distances, cheaper/cuter shoes, getting on and off the floor easily, no longer in plus sizes, feeling better  Surgery date: 05/01/15 Surgery type: RYGB Start weight at [facility]: 259 lbs on 03/01/15 (261 lbs) Weight today: 175 lbs Weight change: 17.2 lbs Total weight lost: 86 lbs  Weight loss goal: 140-170 lbs  TANITA  BODY COMP RESULTS  04/10/15 05/16/15 06/28/15 08/07/15 09/14/15 12/14/15 04/17/16   BMI (kg/m^2) N/A 43.2 41.3 37.8 36.6 34 31   Fat Mass (lbs)  131.5 113.5 102.5 90 77 65.8   Fat Free Mass (lbs)  112.5 119.5 111 116.5 115.2 109.2   Total Body Water (lbs)  82.5 87.5 81.5 85.5 81.8 77    Preferred Learning Style:  No preference indicated   Learning Readiness:   Ready  24-hr recall: B (9-9:30 AM): Balanced Break (7g) Snk (AM):   L (PM): roast beef and cheese on thin bread OR high protein, low carb Lean Cuisines (14g) Snk (PM):   D (6 PM): meatloaf and green beans (21g) Snk (PM):  Fluid intake: at least 28 oz water and unsweet tea Estimated total protein intake: 40-50 grams per day  Medications: none; Prevacid prn only Supplementation: inconsistent  Using straws: yes Drinking while eating: sometimes Hair loss: no Carbonated beverages: no N/V/D/C: vomits if she eats too much and diarrhea every morning Dumping syndrome: yes, with fruit  Recent physical activity: did not  assess today  Progress Towards Goal(s):  In progress.    Nutritional Diagnosis:  Agency-3.3 Overweight/obesity related to past poor dietary habits and physical inactivity as evidenced by patient w/ recent RYGB surgery following dietary guidelines for continued weight loss.     Intervention:  Nutrition counseling provided.  Teaching Method Utilized:  Visual Auditory Hands on  Barriers to learning/adherence to lifestyle change: joint pain  Demonstrated degree of understanding via:  Teach Back   Monitoring/Evaluation:  Dietary intake, exercise, and body weight. Follow up prn.

## 2016-05-08 ENCOUNTER — Ambulatory Visit (INDEPENDENT_AMBULATORY_CARE_PROVIDER_SITE_OTHER): Payer: BLUE CROSS/BLUE SHIELD | Admitting: Gastroenterology

## 2016-05-08 ENCOUNTER — Encounter: Payer: Self-pay | Admitting: Gastroenterology

## 2016-05-08 VITALS — BP 104/68 | HR 84 | Ht 62.5 in | Wt 175.2 lb

## 2016-05-08 DIAGNOSIS — F458 Other somatoform disorders: Secondary | ICD-10-CM | POA: Diagnosis not present

## 2016-05-08 DIAGNOSIS — K219 Gastro-esophageal reflux disease without esophagitis: Secondary | ICD-10-CM

## 2016-05-08 DIAGNOSIS — R0989 Other specified symptoms and signs involving the circulatory and respiratory systems: Secondary | ICD-10-CM

## 2016-05-08 NOTE — Patient Instructions (Signed)

## 2016-05-18 IMAGING — CT CT RENAL STONE PROTOCOL
2 of 4 series · 16 of 46 positions shown, 18 images · non-contrast
Comparison: Abdominal ultrasound March 09, 2015

CLINICAL DATA: Epigastric pain for 4 hours, nausea, vomiting and
hematuria. History of kidney stones, hysterectomy, cholecystectomy,
diabetes, gastric bypass surgery, LEFT breast cancer, status post
chemo radiation.

EXAM:
CT ABDOMEN AND PELVIS WITHOUT CONTRAST
TECHNIQUE: Multidetector CT imaging of the abdomen and pelvis was performed
following the standard protocol without IV contrast.

[Series 2: renal stone > 200 lbs 5.0 b31f · axial · 0.92mm/px · z∈[-506,-71]mm · 13 of 95 slices shown, 15 images]
[im 4/95  soft-tissue]
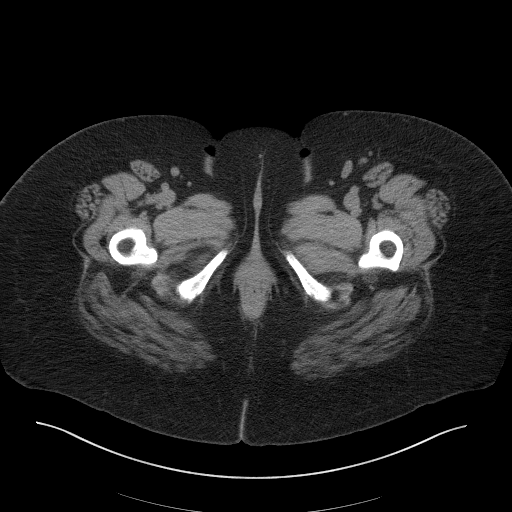
[im 4/95  bone]
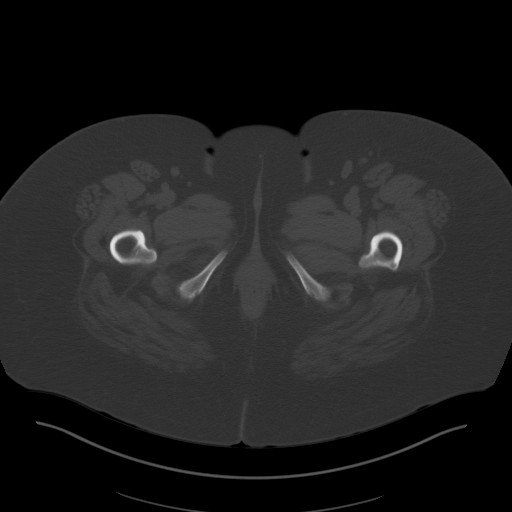
[im 12/95  soft-tissue]
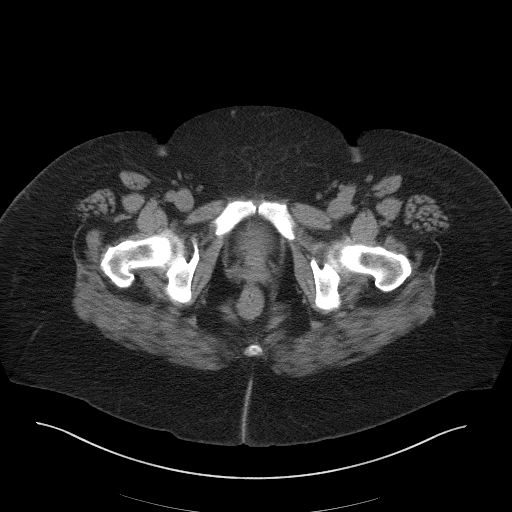
[im 20/95  soft-tissue]
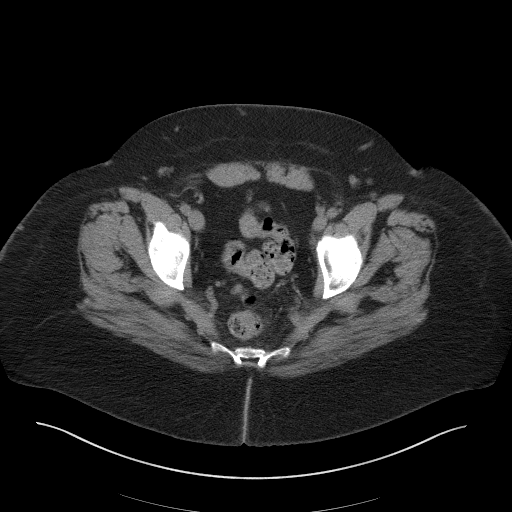
[im 28/95  soft-tissue]
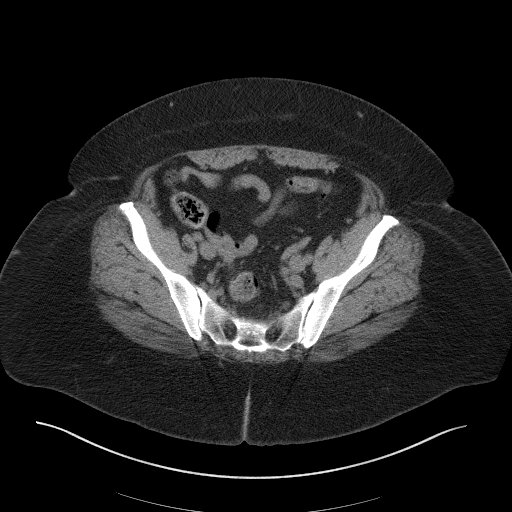
[im 32/95  soft-tissue]
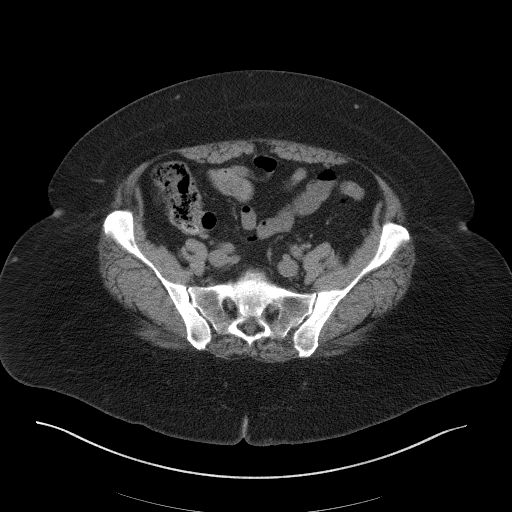
[im 40/95  soft-tissue]
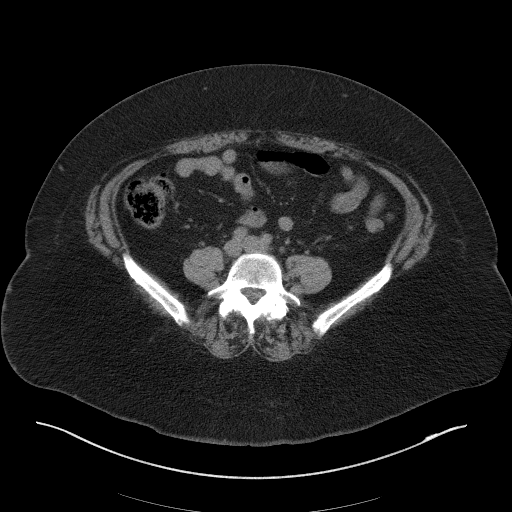
[im 48/95  soft-tissue]
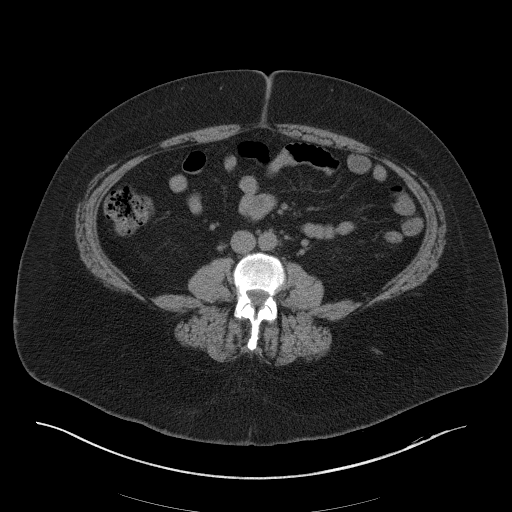
[im 55/95  soft-tissue]
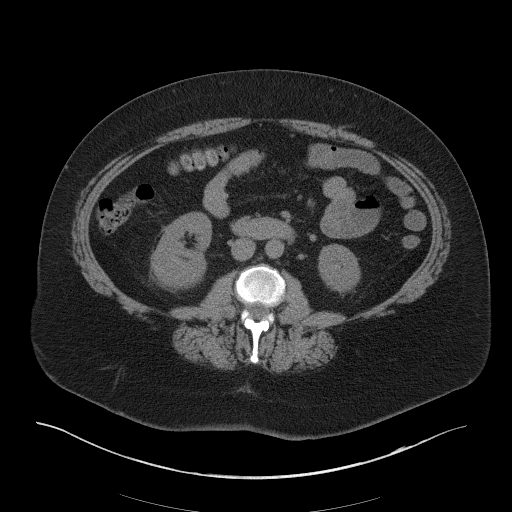
[im 63/95  soft-tissue]
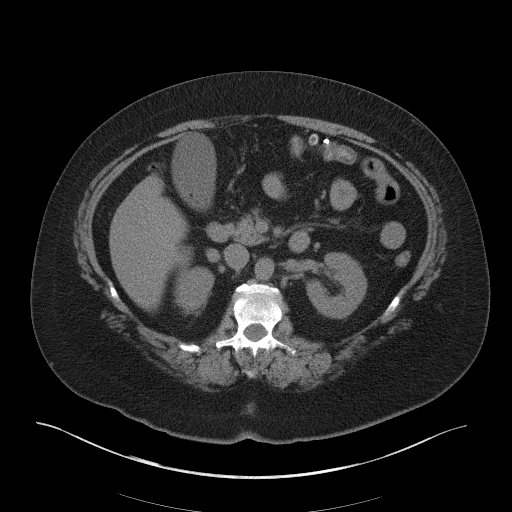
[im 63/95  bone]
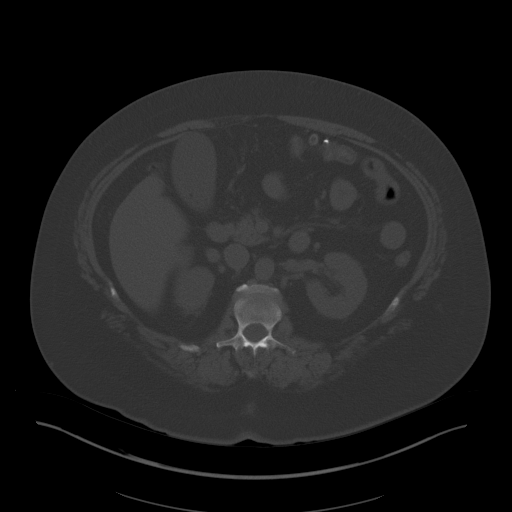
[im 67/95  soft-tissue]
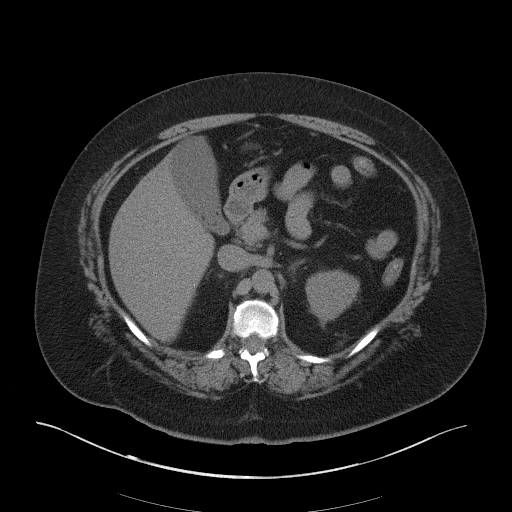
[im 75/95  soft-tissue]
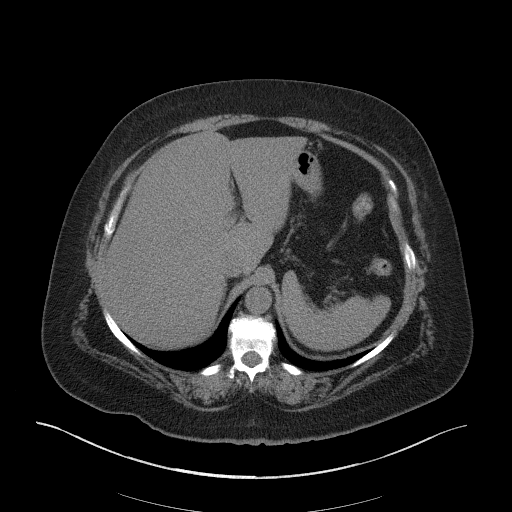
[im 83/95  soft-tissue]
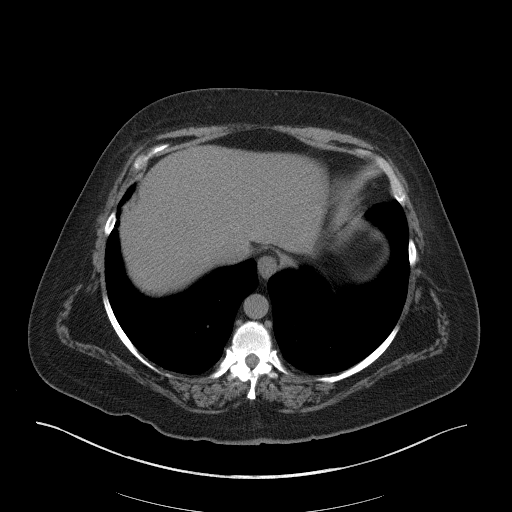
[im 91/95  soft-tissue]
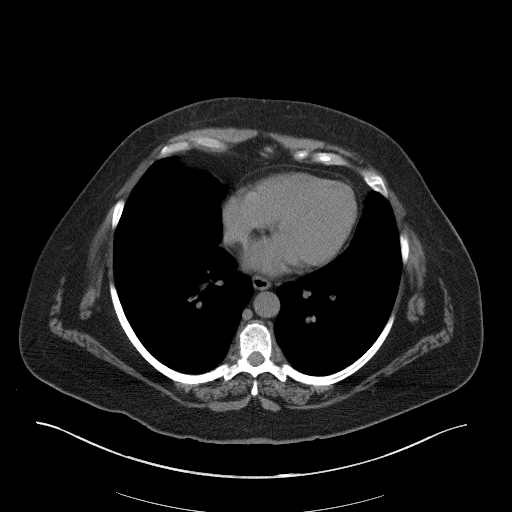

[Series 5: renal stone 3.0 coronal · coronal · 0.82mm/px · 3 of 82 slices shown]
[im 28/82  soft-tissue]
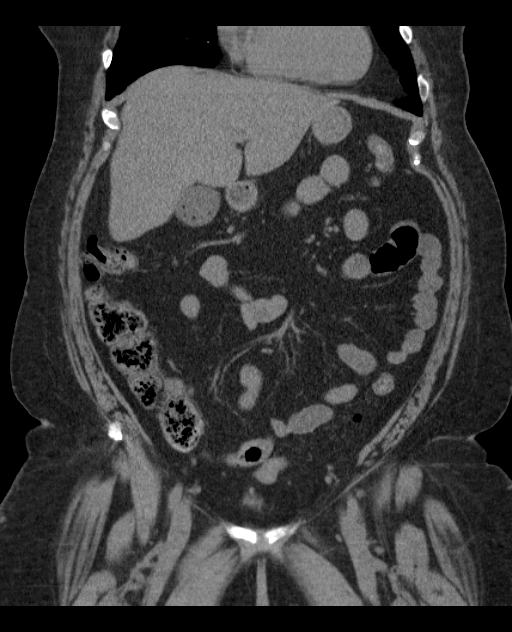
[im 37/82  soft-tissue]
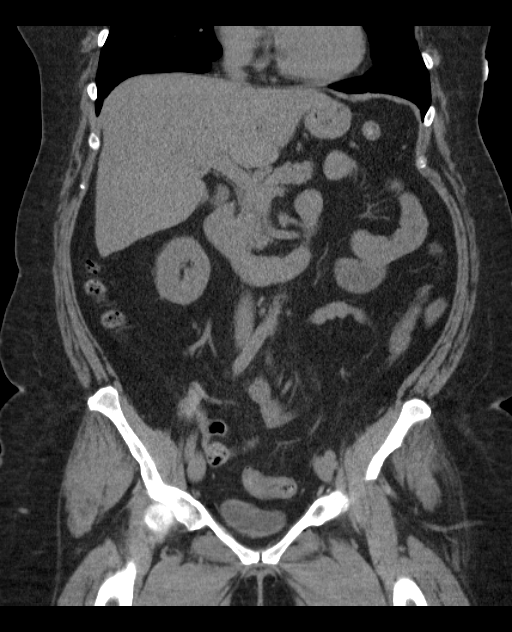
[im 46/82  soft-tissue]
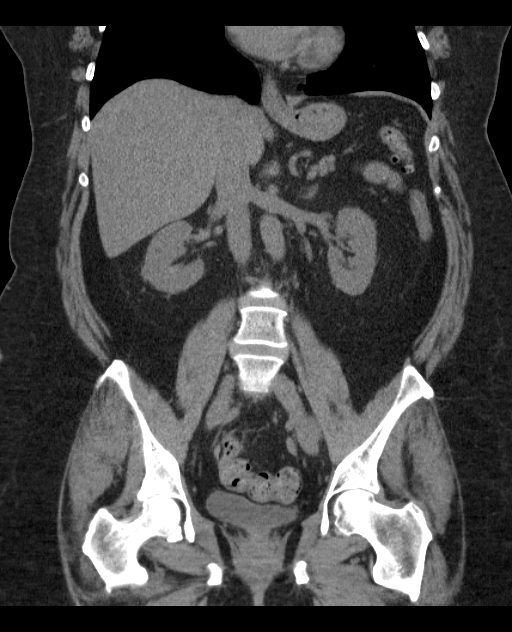

[16 of 46 positions shown; findings below may reference images not displayed]

FINDINGS: LUNG BASES: 6 mm RIGHT lower lobe sub solid pulmonary nodule. 3 mm
RIGHT lower lobe pulmonary nodule. 4 mm LEFT lower lobe pulmonary
nodule. RIGHT middle lobe atelectasis/scarring.

KIDNEYS/BLADDER: Kidneys are orthotopic, demonstrating normal size
and morphology. No nephrolithiasis, hydronephrosis; limited
assessment for renal masses on this nonenhanced examination. The
unopacified ureters are normal in course and caliber. Urinary
bladder is partially distended and unremarkable.

SOLID ORGANS: The liver, spleen, pancreas and adrenal glands are
unremarkable for this non-contrast examination. Gallbladder is
distended with multiple noncalcified small gallstones, no CT
findings of acute cholecystitis.

GASTROINTESTINAL TRACT: Tiny hiatal hernia. The stomach, small and
large bowel are normal in course and caliber without inflammatory
changes, the sensitivity may be decreased by lack of enteric
contrast. Mild colonic diverticulosis. Normal appendix.

PERITONEUM/RETROPERITONEUM: Aortoiliac vessels are normal in course
and caliber. No lymphadenopathy by CT size criteria. Status post
hysterectomy. No intraperitoneal free fluid nor free air.

SOFT TISSUES/ OSSEOUS STRUCTURES: Nonsuspicious. Severe lower lumbar
facet arthropathy. Severe T11-12 disc height loss, vacuum disc,
endplate sclerosis consistent with degenerative disc.
IMPRESSION: No urolithiasis, obstructive uropathy nor acute intra-abdominal/
pelvic process.

Multiple subcentimeter pulmonary nodules. Given patient's history of
lung cancer, recommend follow-up CT of the chest on a nonemergent
basis.

Cholelithiasis.  Mild colonic diverticulosis.

## 2016-06-07 ENCOUNTER — Ambulatory Visit (AMBULATORY_SURGERY_CENTER): Payer: BLUE CROSS/BLUE SHIELD | Admitting: Gastroenterology

## 2016-06-07 ENCOUNTER — Encounter: Payer: Self-pay | Admitting: Gastroenterology

## 2016-06-07 ENCOUNTER — Telehealth: Payer: Self-pay

## 2016-06-07 VITALS — BP 103/65 | HR 88 | Temp 97.8°F | Resp 20 | Ht 62.5 in | Wt 175.0 lb

## 2016-06-07 DIAGNOSIS — K2211 Ulcer of esophagus with bleeding: Secondary | ICD-10-CM

## 2016-06-07 DIAGNOSIS — K2210 Ulcer of esophagus without bleeding: Secondary | ICD-10-CM

## 2016-06-07 DIAGNOSIS — K2270 Barrett's esophagus without dysplasia: Secondary | ICD-10-CM

## 2016-06-07 DIAGNOSIS — R0989 Other specified symptoms and signs involving the circulatory and respiratory systems: Secondary | ICD-10-CM

## 2016-06-07 MED ORDER — SUCRALFATE 1 GM/10ML PO SUSP: 1.0000 g | Freq: Two times a day (BID) | ORAL | 2 refills | Status: DC

## 2016-06-07 MED ORDER — SUCRALFATE 1 G PO TABS: 1.0000 g | ORAL_TABLET | Freq: Two times a day (BID) | ORAL | 3 refills | Status: DC

## 2016-06-07 MED ORDER — SODIUM CHLORIDE 0.9 % IV SOLN: 500.0000 mL | INTRAVENOUS | Status: DC

## 2016-06-07 NOTE — Progress Notes (Signed)
Called to room to assist during endoscopic procedure.  Patient ID and intended procedure confirmed with present staff. Received instructions for my participation in the procedure from the performing physician.  

## 2016-06-07 NOTE — Progress Notes (Signed)
Report to PACU, RN, vss, BBS= Clear.  

## 2016-06-07 NOTE — Telephone Encounter (Signed)
Thank you Early Chars.

## 2016-06-07 NOTE — Telephone Encounter (Signed)
Beth,  I did not put a 3 month appointment in because schedule isn't out that far.  I will put in a recall for 09-08-16.

## 2016-06-07 NOTE — Op Note (Signed)
[place] Patient Name: Lindsey Keller Procedure Date: 06/07/2016 10:26 AM MRN: [id number] Endoscopist: Mauri Pole , MD Age: 60 Referring MD:  Date of Birth: 09-14-1955 Gender: Female Account #: [id number] Procedure:                Upper GI endoscopy Indications:              Dysphagia, Surveillance for malignancy due to                            personal history of Barrett's esophagus, Esophageal                            reflux symptoms that persist despite appropriate                            therapy Medicines:                Monitored Anesthesia Care Procedure:                Pre-Anesthesia Assessment:                           - Prior to the procedure, a History and Physical                            was performed, and patient medications and                            allergies were reviewed. The patient's tolerance of                            previous anesthesia was also reviewed. The risks                            and benefits of the procedure and the sedation                            options and risks were discussed with the patient.                            All questions were answered, and informed consent                            was obtained. Prior Anticoagulants: The patient has                            taken no previous anticoagulant or antiplatelet                            agents. ASA Grade Assessment: III - A patient with                            severe systemic disease. After reviewing the risks  and benefits, the patient was deemed in                            satisfactory condition to undergo the procedure.                           After obtaining informed consent, the endoscope was                            passed under direct vision. Throughout the                            procedure, the patient's blood pressure, pulse, and                            oxygen saturations were monitored  continuously. The                            Model GIF-[id number]) scope was introduced                            through the mouth, and advanced to the efferent                            jejunal loop. The upper GI endoscopy was                            accomplished without difficulty. The patient                            tolerated the procedure well. Scope In: Scope Out: Findings:                 Few cratered esophageal ulcers with oozing blood                            and stigmata of recent bleeding were found 27 to 30                            cm from the incisors. The largest lesion was 5 mm                            in largest dimension. Biopsies were taken with a                            cold forceps for histology.                           Evidence of a Roux-en-Y gastrojejunostomy was                            found. The gastrojejunal anastomosis was                            characterized by  ulceration ~4-3mm at anastomosis.                            This was traversed. The jejunojejunal anastomosis                            was characterized by healthy appearing mucosa. The                            duodenum-to-jejunum limb was not examined as it                            could not be found.                           The examined jejunum was normal. Complications:            No immediate complications. Estimated Blood Loss:     Estimated blood loss was minimal. Impression:               - Bleeding esophageal ulcers. Biopsied.                           - Roux-en-Y gastrojejunostomy with gastrojejunal                            anastomosis characterized by ulceration.                           - Normal examined jejunum. Recommendation:           - Resume previous diet.                           - Continue present medications.                           - Await pathology results.                           - No aspirin, ibuprofen, naproxen, or other                             non-steroidal anti-inflammatory drugs.                           - Repeat upper endoscopy in 3 months for                            surveillance.                           - Return to GI clinic PRN.                           - Use sucralfate tablets 1 gram PO BID for 3 months. Mauri Pole, MD 06/07/2016 10:57:10 AM This report has been signed electronically.

## 2016-06-07 NOTE — Patient Instructions (Signed)
Discharge instructions given. Biopsies taken. No aspirin,ibuprofen,naproxen, or other non-steroidal anti-inflammatory drugs. Prescription sent into pharmacy. YOU HAD AN ENDOSCOPIC PROCEDURE TODAY AT [facility]:   Refer to the procedure report that was given to you for any specific questions about what was found during the examination.  If the procedure report does not answer your questions, please call your gastroenterologist to clarify.  If you requested that your care partner not be given the details of your procedure findings, then the procedure report has been included in a sealed envelope for you to review at your convenience later.  YOU SHOULD EXPECT: Some feelings of bloating in the abdomen. Passage of more gas than usual.  Walking can help get rid of the air that was put into your GI tract during the procedure and reduce the bloating. If you had a lower endoscopy (such as a colonoscopy or flexible sigmoidoscopy) you may notice spotting of blood in your stool or on the toilet paper. If you underwent a bowel prep for your procedure, you may not have a normal bowel movement for a few days.  Please Note:  You might notice some irritation and congestion in your nose or some drainage.  This is from the oxygen used during your procedure.  There is no need for concern and it should clear up in a day or so.  SYMPTOMS TO REPORT IMMEDIATELY:    Following upper endoscopy (EGD)  Vomiting of blood or coffee ground material  New chest pain or pain under the shoulder blades  Painful or persistently difficult swallowing  New shortness of breath  Fever of 100F or higher  Black, tarry-looking stools  For urgent or emergent issues, a gastroenterologist can be reached at any hour by calling [phone].   DIET:  We do recommend a small meal at first, but then you may proceed to your regular diet.  Drink plenty of fluids but you should avoid alcoholic beverages for 24  hours.  ACTIVITY:  You should plan to take it easy for the rest of today and you should NOT DRIVE or use heavy machinery until tomorrow (because of the sedation medicines used during the test).    FOLLOW UP: Our staff will call the number listed on your records the next business day following your procedure to check on you and address any questions or concerns that you may have regarding the information given to you following your procedure. If we do not reach you, we will leave a message.  However, if you are feeling well and you are not experiencing any problems, there is no need to return our call.  We will assume that you have returned to your regular daily activities without incident.  If any biopsies were taken you will be contacted by phone or by letter within the next 1-3 weeks.  Please call us at [phone] if you have not heard about the biopsies in 3 weeks.    SIGNATURES/CONFIDENTIALITY: You and/or your care partner have signed paperwork which will be entered into your electronic medical record.  These signatures attest to the fact that that the information above on your After Visit Summary has been reviewed and is understood.  Full responsibility of the confidentiality of this discharge information lies with you and/or your care-partner.

## 2016-06-10 ENCOUNTER — Telehealth: Payer: Self-pay

## 2016-06-10 NOTE — Telephone Encounter (Signed)
  Follow up Call-  Call back number 06/07/2016  Post procedure Call Back phone  # [phone]  Permission to leave phone message Yes  Some recent data might be hidden     Patient questions:  Do you have a fever, pain , or abdominal swelling? No. Pain Score  0 *  Have you tolerated food without any problems? Yes.    Have you been able to return to your normal activities? Yes.    Do you have any questions about your discharge instructions: Diet   No. Medications  No. Follow up visit  No.  Do you have questions or concerns about your Care? No.  Actions: * If pain score is 4 or above: No action needed, pain <4.

## 2016-06-10 NOTE — Progress Notes (Signed)
Lindsey Keller    [id number]    [birth year]  Primary Care Physician:LEAGUE-SOBON, Anderson Malta, MD  Referring Physician: [name], MD [place], [place] [address]  Chief complaint:  GERD  HPI:  60 year old female with history of obesity, GERD, Barrett's esophagus status post gastric bypass surgery here for evaluation with complaints of globus sensation since gastric bypass surgery. She continues to have persistent reflux symptoms. Denies any dysphagia to liquids or solids but she does feel the food go down slow and feels this something stuck in her throat all the time. She also has been feeling more regurgitation and irritation in the throat for which she has been taking omeprazole as needed with some improvement. Denies any abdominal pain, melena or blood per rectum. She was scheduled for esophageal manometry but was cancelled by patient as she had a high deductible. She tried Zantac with no improvement.   Outpatient Encounter Prescriptions as of 05/08/2016  Medication Sig  . acetaminophen (TYLENOL) 160 MG/5ML solution Take 10.2-20.3 mLs (325-650 mg total) by mouth every 4 (four) hours as needed for moderate pain (severe pain).  Marland Kitchen BIOTIN PO Take 1 tablet by mouth daily.  . calcium carbonate (OS-CAL - DOSED IN MG OF ELEMENTAL CALCIUM) 1250 MG tablet Take 1 tablet by mouth daily.    . cholecalciferol (VITAMIN D) 1000 UNITS tablet Take 1,000 Units by mouth daily.    Marland Kitchen loperamide (IMODIUM) 2 MG capsule Take by mouth as needed for diarrhea or loose stools.  . Multiple Vitamin (MULTIVITAMIN WITH MINERALS) TABS tablet Take 1 tablet by mouth daily.  . vitamin B-12 (CYANOCOBALAMIN) 500 MCG tablet Take 500 mcg by mouth daily.  . [DISCONTINUED] colestipol (COLESTID) 1 g tablet TK 1 T PO  AS NEEDED  . [DISCONTINUED] omeprazole (PRILOSEC) 40 MG capsule Take 40 mg by mouth as needed (heartburn).   . [DISCONTINUED] ranitidine (ZANTAC) 300 MG tablet Take 1 tablet (300 mg  total) by mouth at bedtime.   No facility-administered encounter medications on file as of 05/08/2016.     Allergies as of 05/08/2016 - Review Complete 05/08/2016  Allergen Reaction Noted  . Peach [prunus persica] Anaphylaxis and Itching 04/11/2015  . Peanut-containing drug products Anaphylaxis and Itching 04/11/2015  . Adhesive [tape] Itching 03/11/2014  . Keflex [cephalexin]  03/11/2014  . Nsaids  10/03/2015  . Pollen extract Other (See Comments) 04/11/2015  . Shellfish-derived products Other (See Comments) 04/11/2015  . Bextra [valdecoxib] Swelling and Rash 03/11/2014  . Diclofenac Nausea Only and Other (See Comments) 04/11/2015  . Latex Itching 03/11/2014  . Nickel Itching and Rash 04/11/2015    Past Medical History:  Diagnosis Date  . Allergy   . Arthritis   . Asthma    INFREQUENT PROBLEM - NO INHALER CURRENTLY  . Barrett's esophagus   . Breast cancer ([facility])    BREAST CANCER - L LUMPECTOMY - RADIATION, no lymph node involvement  . Complication of anesthesia    INSOMNIA / NIGHTMARES AFTER HYSTERECTOMY X 6 MONTHS  . DDD (degenerative disc disease), lumbar   . Depression   . Diabetes mellitus without complication ([facility])    pt lost 50pds not a DM anymore  . GERD (gastroesophageal reflux disease)   . Glucose intolerance (pre-diabetes)   . IBS (irritable bowel syndrome)   . Obesity   . Other psoriasis    involving hands only  . Sleep - wake disorder   . Spinal stenosis   .  Status post dilation of esophageal narrowing     Past Surgical History:  Procedure Laterality Date  . BREAST LUMPECTOMY  2005   LUMPECTOMY-CANCER  . BUNIONECTOMY Left    L FOOT  . CHOLECYSTECTOMY N/A 05/01/2015   Procedure: LAPAROSCOPIC CHOLECYSTECTOMY WITH INTRAOPERATIVE CHOLANGIOGRAM;  Surgeon: [name], MD;  Location: [facility];  Service: General;  Laterality: N/A;  . COLONOSCOPY    . DIAGNOSTIC LAPAROSCOPY     FOR ENDOMETRIOSIS  . LAPAROSCOPIC ROUX-EN-Y GASTRIC BYPASS WITH HIATAL HERNIA  REPAIR N/A 05/01/2015   Procedure: LAPAROSCOPIC ROUX-EN-Y GASTRIC BYPASS WITH HIATAL HERNIA REPAIR-POSSIBLE CHOLECYSTECTOMY;  Surgeon: [name], MD;  Location: [facility];  Service: General;  Laterality: N/A;  . PARTIAL HYSTERECTOMY     no salpingo-oophorectomy  . PILONIDAL CYST EXCISION    . UPPER GASTROINTESTINAL ENDOSCOPY    . WISDOM TOOTH EXTRACTION      Family History  Problem Relation Age of Onset  . Adopted: Yes    Social History   Social History  . Marital status: Married    Spouse name: N/A  . Number of children: 2  . Years of education: N/A   Occupational History  . teacher    Social History Main Topics  . Smoking status: Current Every Day Smoker    Packs/day: 0.25  . Smokeless tobacco: Never Used  . Alcohol use 4.2 oz/week    7 Glasses of wine per week  . Drug use: No  . Sexual activity: Not on file   Other Topics Concern  . Not on file   Social History Narrative  . No narrative on file      Review of systems: Review of Systems  Constitutional: Negative for fever and chills.  HENT: Negative.   Eyes: Negative for blurred vision.  Respiratory: Negative for cough, shortness of breath and wheezing.   Cardiovascular: Negative for chest pain and palpitations.  Gastrointestinal: as per HPI Genitourinary: Negative for dysuria, urgency, frequency and hematuria.  Musculoskeletal: Negative for myalgias, back pain and joint pain.  Skin: Negative for itching and rash.  Neurological: Negative for dizziness, tremors, focal weakness, seizures and loss of consciousness.  Endo/Heme/Allergies: Positive for seasonal allergies.  Psychiatric/Behavioral: Negative for depression, suicidal ideas and hallucinations.  All other systems reviewed and are negative.   Physical Exam: Vitals:   05/08/16 1522  BP: 104/68  Pulse: 84   Body mass index is 31.54 kg/m. Gen:      No acute distress HEENT:  EOMI, sclera anicteric Neck:     No masses; no thyromegaly Lungs:     Clear to auscultation bilaterally; normal respiratory effort CV:         Regular rate and rhythm; no murmurs Abd:      + bowel sounds; soft, non-tender; no palpable masses, no distension Ext:    No edema; adequate peripheral perfusion Skin:      Warm and dry; no rash Neuro: alert and oriented x 3 Psych: normal mood and affect  Data Reviewed:  Reviewed labs, radiology imaging, old records and pertinent past GI work up   Assessment and Plan/Recommendations: 13 yr F s/p gastric Roux-en-Y with persistent reflux symptoms and globus sensation Will proceed with EGD for evaluation The risks and benefits as well as alternatives of endoscopic procedure(s) have been discussed and reviewed. All questions answered. The patient agrees to proceed. If EGD unremarkable, will need to proceed with esophageal manometry and 24hr pH impedance to evaluate the degree of reflux Zantac as needed Anti reflux measures  25 minutes was spent face-to-face with the patient. Greater than 50% of the time used for counseling as well as treatment plan and follow-up. She had multiple questions which were answered to her satisfaction  K. Denzil Magnuson , MD [phone] Mon-Fri 8a-5p [phone] after 5p, weekends, holidays  CC: [name],*

## 2016-06-12 ENCOUNTER — Encounter: Payer: Self-pay | Admitting: Gastroenterology

## 2016-07-05 ENCOUNTER — Encounter: Payer: Self-pay | Admitting: Gastroenterology

## 2016-07-11 ENCOUNTER — Encounter: Payer: Self-pay | Admitting: Gastroenterology

## 2016-09-05 ENCOUNTER — Ambulatory Visit (AMBULATORY_SURGERY_CENTER): Payer: Self-pay

## 2016-09-05 VITALS — Ht 62.5 in | Wt 161.0 lb

## 2016-09-05 DIAGNOSIS — K254 Chronic or unspecified gastric ulcer with hemorrhage: Secondary | ICD-10-CM

## 2016-09-05 NOTE — Progress Notes (Signed)
No allergies to eggs or soy No past problem with anesthesia except insomnia and nightmares post general anesthesia x 1.  Did OK with MAC here at endoscopy. No diet meds No home oxygen  Declined emmi

## 2016-09-13 ENCOUNTER — Encounter: Payer: Self-pay | Admitting: Gastroenterology

## 2016-09-20 ENCOUNTER — Encounter: Payer: Self-pay | Admitting: Gastroenterology

## 2016-09-20 ENCOUNTER — Ambulatory Visit (AMBULATORY_SURGERY_CENTER): Payer: BLUE CROSS/BLUE SHIELD | Admitting: Gastroenterology

## 2016-09-20 VITALS — BP 114/77 | HR 69 | Temp 97.8°F | Resp 12 | Ht 62.0 in | Wt 161.0 lb

## 2016-09-20 DIAGNOSIS — K2210 Ulcer of esophagus without bleeding: Secondary | ICD-10-CM

## 2016-09-20 DIAGNOSIS — K209 Esophagitis, unspecified without bleeding: Secondary | ICD-10-CM

## 2016-09-20 DIAGNOSIS — K2270 Barrett's esophagus without dysplasia: Secondary | ICD-10-CM

## 2016-09-20 DIAGNOSIS — K254 Chronic or unspecified gastric ulcer with hemorrhage: Secondary | ICD-10-CM

## 2016-09-20 MED ORDER — DEXLANSOPRAZOLE 60 MG PO CPDR: 60.0000 mg | DELAYED_RELEASE_CAPSULE | Freq: Every day | ORAL | 3 refills | Status: DC

## 2016-09-20 MED ORDER — RANITIDINE HCL 300 MG PO TABS: 300.0000 mg | ORAL_TABLET | Freq: Every day | ORAL | 3 refills | Status: DC

## 2016-09-20 MED ORDER — SUCRALFATE 1 GM/10ML PO SUSP: 1.0000 g | Freq: Three times a day (TID) | ORAL | 3 refills | Status: DC

## 2016-09-20 MED ORDER — SODIUM CHLORIDE 0.9 % IV SOLN: 500.0000 mL | INTRAVENOUS | Status: DC

## 2016-09-20 NOTE — Progress Notes (Signed)
Called to room to assist during endoscopic procedure.  Patient ID and intended procedure confirmed with present staff. Received instructions for my participation in the procedure from the performing physician.  

## 2016-09-20 NOTE — Progress Notes (Signed)
Report to PACU, RN, vss, BBS= Clear.  

## 2016-09-20 NOTE — Patient Instructions (Signed)
YOU HAD AN ENDOSCOPIC PROCEDURE TODAY AT [facility]:   Refer to the procedure report that was given to you for any specific questions about what was found during the examination.  If the procedure report does not answer your questions, please call your gastroenterologist to clarify.  If you requested that your care partner not be given the details of your procedure findings, then the procedure report has been included in a sealed envelope for you to review at your convenience later.  YOU SHOULD EXPECT: Some feelings of bloating in the abdomen. Passage of more gas than usual.  Walking can help get rid of the air that was put into your GI tract during the procedure and reduce the bloating. If you had a lower endoscopy (such as a colonoscopy or flexible sigmoidoscopy) you may notice spotting of blood in your stool or on the toilet paper. If you underwent a bowel prep for your procedure, you may not have a normal bowel movement for a few days.  Please Note:  You might notice some irritation and congestion in your nose or some drainage.  This is from the oxygen used during your procedure.  There is no need for concern and it should clear up in a day or so.  SYMPTOMS TO REPORT IMMEDIATELY:    Following upper endoscopy (EGD)  Vomiting of blood or coffee ground material  New chest pain or pain under the shoulder blades  Painful or persistently difficult swallowing  New shortness of breath  Fever of 100F or higher  Black, tarry-looking stools  For urgent or emergent issues, a gastroenterologist can be reached at any hour by calling [phone].   DIET:  We do recommend a small meal at first, but then you may proceed to your regular diet.  Drink plenty of fluids but you should avoid alcoholic beverages for 24 hours.  ACTIVITY:  You should plan to take it easy for the rest of today and you should NOT DRIVE or use heavy machinery until tomorrow (because of the sedation medicines used  during the test).    FOLLOW UP: Our staff will call the number listed on your records the next business day following your procedure to check on you and address any questions or concerns that you may have regarding the information given to you following your procedure. If we do not reach you, we will leave a message.  However, if you are feeling well and you are not experiencing any problems, there is no need to return our call.  We will assume that you have returned to your regular daily activities without incident.  If any biopsies were taken you will be contacted by phone or by letter within the next 1-3 weeks.  Please call us at [phone] if you have not heard about the biopsies in 3 weeks.   Await for biopsy results to determined next repeat Endoscopy Return to GI clinic as needed No Ibuprofen, Naproxen, or other non-steriodal anti-inflammatory drugs Sucralfate1 ,Dexilant and Ranitidine as ordered on medication sheet   SIGNATURES/CONFIDENTIALITY: You and/or your care partner have signed paperwork which will be entered into your electronic medical record.  These signatures attest to the fact that that the information above on your After Visit Summary has been reviewed and is understood.  Full responsibility of the confidentiality of this discharge information lies with you and/or your care-partner.

## 2016-09-20 NOTE — Progress Notes (Signed)
Pt's states no medical or surgical changes since previsit or office visit. 

## 2016-09-20 NOTE — Op Note (Addendum)
[facility] Patient Name: Lindsey Keller Procedure Date: 09/20/2016 10:09 AM MRN: [id number] Endoscopist: Mauri Pole , MD Age: 61 Referring MD:  Date of Birth: 1956/05/26 Gender: Female Account #: [id number] Procedure:                Upper GI endoscopy Indications:              Follow-up of esophageal ulcers and severe                            esophagitis, history of Barrett's esophagus with no                            dysplasia, s/p gastric bypass surgery Medicines:                Monitored Anesthesia Care Procedure:                Pre-Anesthesia Assessment:                           - Prior to the procedure, a History and Physical                            was performed, and patient medications and                            allergies were reviewed. The patient's tolerance of                            previous anesthesia was also reviewed. The risks                            and benefits of the procedure and the sedation                            options and risks were discussed with the patient.                            All questions were answered, and informed consent                            was obtained. Prior Anticoagulants: The patient has                            taken no previous anticoagulant or antiplatelet                            agents. ASA Grade Assessment: II - A patient with                            mild systemic disease. After reviewing the risks                            and benefits, the patient was deemed in  satisfactory condition to undergo the procedure.                           After obtaining informed consent, the endoscope was                            passed under direct vision. Throughout the                            procedure, the patient's blood pressure, pulse, and                            oxygen saturations were monitored continuously. The                            Model  [id number]-[id number]) scope was introduced                            through the mouth, and advanced to the afferent and                            efferent jejunal loops. After obtaining informed                            consent, the endoscope was passed under direct                            vision. Throughout the procedure, the patient's                            blood pressure, pulse, and oxygen saturations were                            monitored continuously.The upper GI endoscopy was                            accomplished without difficulty. The patient                            tolerated the procedure well. Scope In: Scope Out: Findings:                 LA Grade D (one or more mucosal breaks involving at                            least 75% of esophageal circumference) esophagitis                            with bleeding was found 25 to 30 cm from the                            incisors. Biopsies were taken with a cold forceps  for histology.                           Few cratered and linear esophageal ulcers with heme                            and small amount of bleeding and stigmata of recent                            bleeding were found 25 to 30 cm from the incisors.                            The largest lesion was 10 mm in largest dimension.                           Evidence of a gastric bypass was found. A gastric                            pouch with a small size was found. Small hiatal                            hernia ~2-3cm. retroflexion not performed in the                            gastric pouch.The staple line appeared intact. The                            gastrojejunal anastomosis was characterized by                            healthy appearing mucosa. This was traversed. The                            pouch-to-jejunum limb measured 5 cm from                            anastomosis and was characterized by healthy                             appearing mucosa. The jejunojejunal anastomosis was                            characterized by healthy appearing mucosa.                           The examined jejunum was normal. Complications:            No immediate complications. Estimated Blood Loss:     Estimated blood loss was minimal. Impression:               - LA Grade D erosive esophagitis. Biopsied.                           - Heme and small amount of bleeding esophageal  ulcers.                           - Gastric bypass with a small-sized pouch and                            intact staple line. Gastrojejunal anastomosis                            characterized by healthy appearing mucosa.                           - Normal examined jejunum. Recommendation:           - Resume previous diet.                           - Continue present medications.                           - Sucralfate 1 gm TID before meals and at bedtime                           - Dexilant 60mg  daily                           - Ranitidine 300mg  at bedtime                           - Await pathology results.                           - No ibuprofen, naproxen, or other non-steroidal                            anti-inflammatory drugs.                           - Repeat upper endoscopy after studies are complete                            for surveillance based on pathology results.                           - Return to GI clinic PRN. Mauri Pole, MD 09/20/2016 10:39:43 AM This report has been signed electronically. CC Letter to:             Leighton Ruff. Redmond Pulling MD, MD

## 2016-09-23 ENCOUNTER — Telehealth: Payer: Self-pay

## 2016-09-23 NOTE — Telephone Encounter (Signed)
  Follow up Call-  Call back number 09/20/2016 06/07/2016  Post procedure Call Back phone  # [phone]  Permission to leave phone message Yes Yes  Some recent data might be hidden     Patient questions:  Do you have a fever, pain , or abdominal swelling? No. Pain Score  0 *  Have you tolerated food without any problems? Yes.    Have you been able to return to your normal activities? Yes.    Do you have any questions about your discharge instructions: Diet   Yes.  No questions about diet Medications  No. Follow up visit  No.  Do you have questions or concerns about your Care? No.  Actions: * If pain score is 4 or above: No action needed, pain <4.

## 2016-09-27 ENCOUNTER — Encounter: Payer: Self-pay | Admitting: Gastroenterology

## 2016-10-12 ENCOUNTER — Other Ambulatory Visit: Payer: Self-pay | Admitting: Gastroenterology

## 2016-10-12 DIAGNOSIS — K2210 Ulcer of esophagus without bleeding: Secondary | ICD-10-CM

## 2017-01-02 ENCOUNTER — Emergency Department (HOSPITAL_BASED_OUTPATIENT_CLINIC_OR_DEPARTMENT_OTHER): Payer: BLUE CROSS/BLUE SHIELD

## 2017-01-02 ENCOUNTER — Emergency Department (HOSPITAL_BASED_OUTPATIENT_CLINIC_OR_DEPARTMENT_OTHER)
Admission: EM | Admit: 2017-01-02 | Discharge: 2017-01-03 | Disposition: A | Discharge status: Home / Self Care | Payer: BLUE CROSS/BLUE SHIELD | Attending: Emergency Medicine | Admitting: Emergency Medicine

## 2017-01-02 ENCOUNTER — Encounter (HOSPITAL_BASED_OUTPATIENT_CLINIC_OR_DEPARTMENT_OTHER): Payer: Self-pay

## 2017-01-02 DIAGNOSIS — N390 Urinary tract infection, site not specified: Secondary | ICD-10-CM | POA: Diagnosis present

## 2017-01-02 DIAGNOSIS — Z91010 Allergy to peanuts: Secondary | ICD-10-CM | POA: Insufficient documentation

## 2017-01-02 DIAGNOSIS — R6889 Other general symptoms and signs: Secondary | ICD-10-CM

## 2017-01-02 DIAGNOSIS — F17200 Nicotine dependence, unspecified, uncomplicated: Secondary | ICD-10-CM | POA: Insufficient documentation

## 2017-01-02 DIAGNOSIS — Z91040 Latex allergy status: Secondary | ICD-10-CM | POA: Insufficient documentation

## 2017-01-02 DIAGNOSIS — J45909 Unspecified asthma, uncomplicated: Secondary | ICD-10-CM | POA: Insufficient documentation

## 2017-01-02 DIAGNOSIS — E119 Type 2 diabetes mellitus without complications: Secondary | ICD-10-CM | POA: Insufficient documentation

## 2017-01-02 DIAGNOSIS — J111 Influenza due to unidentified influenza virus with other respiratory manifestations: Secondary | ICD-10-CM | POA: Diagnosis not present

## 2017-01-02 DIAGNOSIS — Z79899 Other long term (current) drug therapy: Secondary | ICD-10-CM | POA: Insufficient documentation

## 2017-01-02 DIAGNOSIS — R079 Chest pain, unspecified: Secondary | ICD-10-CM | POA: Diagnosis not present

## 2017-01-02 LAB — URINALYSIS, ROUTINE W REFLEX MICROSCOPIC
Bilirubin Urine: NEGATIVE
Glucose, UA: NEGATIVE mg/dL
Hgb urine dipstick: NEGATIVE
Ketones, ur: 40 mg/dL — AB
LEUKOCYTES UA: NEGATIVE
Nitrite: NEGATIVE
PROTEIN: NEGATIVE mg/dL
Specific Gravity, Urine: 1.011 (ref 1.005–1.030)
pH: 6 (ref 5.0–8.0)

## 2017-01-02 LAB — COMPREHENSIVE METABOLIC PANEL
ALBUMIN: 3.8 g/dL (ref 3.5–5.0)
ALT: 25 U/L (ref 14–54)
ANION GAP: 11 (ref 5–15)
AST: 31 U/L (ref 15–41)
Alkaline Phosphatase: 105 U/L (ref 38–126)
BILIRUBIN TOTAL: 1.5 mg/dL — AB (ref 0.3–1.2)
BUN: 10 mg/dL (ref 6–20)
CHLORIDE: 102 mmol/L (ref 101–111)
CO2: 22 mmol/L (ref 22–32)
Calcium: 9.1 mg/dL (ref 8.9–10.3)
Creatinine, Ser: 0.72 mg/dL (ref 0.44–1.00)
GFR calc Af Amer: >60 mL/min (ref >60)
GFR calc non Af Amer: >60 mL/min (ref >60)
GLUCOSE: 127 mg/dL — AB (ref 65–99)
POTASSIUM: 3.8 mmol/L (ref 3.5–5.1)
SODIUM: 135 mmol/L (ref 135–145)
TOTAL PROTEIN: 7.4 g/dL (ref 6.5–8.1)

## 2017-01-02 LAB — CBC WITH DIFFERENTIAL/PLATELET
BASOS PCT: 0 %
Basophils Absolute: 0 10*3/uL (ref 0.0–0.1)
Eosinophils Absolute: 0.1 10*3/uL (ref 0.0–0.7)
Eosinophils Relative: 1 %
HEMATOCRIT: 47.2 % — AB (ref 36.0–46.0)
Hemoglobin: 16.6 g/dL — ABNORMAL HIGH (ref 12.0–15.0)
LYMPHS PCT: 3 %
Lymphs Abs: 0.3 10*3/uL — ABNORMAL LOW (ref 0.7–4.0)
MCH: 34.6 pg — ABNORMAL HIGH (ref 26.0–34.0)
MCHC: 35.2 g/dL (ref 30.0–36.0)
MCV: 98.3 fL (ref 78.0–100.0)
MONOS PCT: 4 %
Monocytes Absolute: 0.5 10*3/uL (ref 0.1–1.0)
NEUTROS ABS: 11.1 10*3/uL — AB (ref 1.7–7.7)
NEUTROS PCT: 92 %
Platelets: 247 10*3/uL (ref 150–400)
RBC: 4.8 MIL/uL (ref 3.87–5.11)
RDW: 12.6 % (ref 11.5–15.5)
WBC: 12 10*3/uL — ABNORMAL HIGH (ref 4.0–10.5)

## 2017-01-02 LAB — PROTIME-INR
INR: 1.03
Prothrombin Time: 13.5 seconds (ref 11.4–15.2)

## 2017-01-02 LAB — I-STAT CG4 LACTIC ACID, ED: Lactic Acid, Venous: 1.2 mmol/L (ref 0.5–1.9)

## 2017-01-02 MED ORDER — ACETAMINOPHEN 325 MG PO TABS
650.0000 mg | ORAL_TABLET | Freq: Four times a day (QID) | ORAL | Status: DC | PRN
  Administered 2017-01-02: 650 mg via ORAL
  Filled 2017-01-02: qty 2

## 2017-01-02 MED ORDER — OSELTAMIVIR PHOSPHATE 75 MG PO CAPS: 75.0000 mg | ORAL_CAPSULE | Freq: Two times a day (BID) | ORAL | 0 refills | Status: DC

## 2017-01-02 MED ORDER — PIPERACILLIN-TAZOBACTAM 3.375 G IVPB 30 MIN
3.3750 g | Freq: Once | INTRAVENOUS | Status: AC
  Administered 2017-01-02: 3.375 g via INTRAVENOUS
  Filled 2017-01-02 (×2): qty 50

## 2017-01-02 MED ORDER — VANCOMYCIN HCL IN DEXTROSE 1-5 GM/200ML-% IV SOLN
1000.0000 mg | Freq: Once | INTRAVENOUS | Status: AC
  Administered 2017-01-02: 1000 mg via INTRAVENOUS
  Filled 2017-01-02: qty 200

## 2017-01-02 MED ORDER — OSELTAMIVIR PHOSPHATE 75 MG PO CAPS
75.0000 mg | ORAL_CAPSULE | Freq: Once | ORAL | Status: AC
  Administered 2017-01-02: 75 mg via ORAL
  Filled 2017-01-02: qty 1

## 2017-01-02 MED ORDER — ACETAMINOPHEN 325 MG PO TABS
650.0000 mg | ORAL_TABLET | Freq: Once | ORAL | Status: AC
  Administered 2017-01-02: 650 mg via ORAL
  Filled 2017-01-02: qty 2

## 2017-01-02 MED ORDER — PIPERACILLIN-TAZOBACTAM 3.375 G IVPB: 3.3750 g | Freq: Three times a day (TID) | INTRAVENOUS | Status: DC

## 2017-01-02 MED ORDER — SODIUM CHLORIDE 0.9 % IV BOLUS (SEPSIS)
1000.0000 mL | Freq: Once | INTRAVENOUS | Status: AC
  Administered 2017-01-02: 1000 mL via INTRAVENOUS

## 2017-01-02 MED ORDER — VANCOMYCIN HCL IN DEXTROSE 750-5 MG/150ML-% IV SOLN
750.0000 mg | Freq: Two times a day (BID) | INTRAVENOUS | Status: DC
  Filled 2017-01-02: qty 150

## 2017-01-02 NOTE — ED Triage Notes (Signed)
Pt states she was dx with UTI yesterday-fever, n/v x today-slow gait

## 2017-01-02 NOTE — ED Notes (Signed)
Patient transported to CT 

## 2017-01-02 NOTE — ED Notes (Signed)
ED Provider at bedside. 

## 2017-01-02 NOTE — ED Provider Notes (Signed)
[facility] DEPT [facility] Provider Note   CSN: [id number] Arrival date & time: 01/02/17  1752 By signing my name below, I, Lindsey Keller, attest that this documentation has been prepared under the direction and in the presence of Lindsey Sorrow, MD . Electronically Signed: Dyke Keller, Scribe. 01/02/2017. 7:01 PM.   History   Chief Complaint Chief Complaint  Patient presents with  . Urinary Tract Infection    HPI Lindsey Keller is a 61 y.o. female who presents to the Emergency Department complaining of sudden onset, progressively worsening fever onset at 2 this AM. She states that yesterday she began experiencing dysuria and urinary urgency. She was seen for the same at  [facility] yesterday where she was diagnosed with a UTI and prescribed Macrobid which she has taken with relief of urinary symptoms. Per pt, at 2 AM, she woke up with subjective fever and chills which worsened throughout the day. When she hecked her temperature at noon today, it was 103. She also reports associated cough, congestion, chest pain with she describes as tightness, SOB, generalized body aches, fatigue, diarrhea, nausea, and vomiting. No OTC treatments tried for these symptoms PTA.  No alleviating factors noted. She states she has had frequent diarrhea since her gastric bypass surgery in 2016. 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 She denies use of blood thinners. Pt denies any rhinorrhea, sore throat, visual disturbance, abdominal pain, back pain, hematuria, rashes, joint swelling, or headaches.    PCP: Lindsey Calamity, MD   The history is provided by the patient. No language interpreter was used.  Urinary Tract Infection   The current episode started yesterday. The problem has been resolved. The quality of the pain is described as burning. The maximum temperature recorded prior to her arrival was 103 to 104 F. The fever has been present for less than 1 day. Associated symptoms  include chills, nausea, vomiting and urgency. Pertinent negatives include no hematuria. She has tried antibiotics for the symptoms.   Past Medical History:  Diagnosis Date  . Allergy   . Arthritis   . Asthma    INFREQUENT PROBLEM - NO INHALER CURRENTLY  . Barrett's esophagus   . Breast cancer ([place])    BREAST CANCER - L LUMPECTOMY - RADIATION, no lymph node involvement  . Complication of anesthesia    INSOMNIA / NIGHTMARES AFTER HYSTERECTOMY X 6 MONTHS  . DDD (degenerative disc disease), lumbar   . Depression   . Diabetes mellitus without complication ([place])    pt lost 50pds not a DM anymore  . GERD (gastroesophageal reflux disease)   . Glucose intolerance (pre-diabetes)   . IBS (irritable bowel syndrome)   . Obesity   . Other psoriasis    involving hands only  . Sleep - wake disorder   . Spinal stenosis   . Status post dilation of esophageal narrowing     Patient Active Problem List   Diagnosis Date Noted  . Psoriasis 10/05/2015  . Gastroduodenal ulcer 10/05/2015  . 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 Left shoulder pain 10/05/2015  . Multiple lung nodules 09/11/2015  . Obesity, Class III, BMI 40-49.9 (morbid obesity) ([place]) 05/01/2015  . Acute calculous cholecystitis 05/01/2015  . Hypercholesterolemia with hypertriglyceridemia 05/01/2015  . Barrett's esophagus with esophagitis 05/01/2015  . Lumbosacral pain 05/01/2015  . Osteoarthritis, knee 05/01/2015  . S/P gastric bypass 05/01/2015  . Lumbar canal stenosis 02/21/2014  . Major depressive disorder, single episode, moderate ([place]) 04/16/2013  . Allergic rhinitis 11/12/2012  .  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 Acid reflux 11/18/2011  . DM type 2 (diabetes mellitus, type 2) ([place]) 07/19/2011    Past Surgical History:  Procedure Laterality Date  . ABDOMINAL HYSTERECTOMY    . BREAST LUMPECTOMY  2005   LUMPECTOMY-CANCER  . BUNIONECTOMY Left    L FOOT  . CHOLECYSTECTOMY N/A 05/01/2015   Procedure:  LAPAROSCOPIC CHOLECYSTECTOMY WITH INTRAOPERATIVE CHOLANGIOGRAM;  Surgeon: [name], MD;  Location: [facility];  Service: General;  Laterality: N/A;  . COLONOSCOPY    . DIAGNOSTIC LAPAROSCOPY     FOR ENDOMETRIOSIS  . LAPAROSCOPIC ROUX-EN-Y GASTRIC BYPASS WITH HIATAL HERNIA REPAIR N/A 05/01/2015   Procedure: LAPAROSCOPIC ROUX-EN-Y GASTRIC BYPASS WITH HIATAL HERNIA REPAIR-POSSIBLE CHOLECYSTECTOMY;  Surgeon: [name], MD;  Location: [facility];  Service: General;  Laterality: N/A;  . PARTIAL HYSTERECTOMY     no salpingo-oophorectomy  . PILONIDAL CYST EXCISION    . UPPER GASTROINTESTINAL ENDOSCOPY    . WISDOM TOOTH EXTRACTION      OB History    No data available       Home Medications    Prior to Admission medications   Medication Sig Start Date End Date Taking? Authorizing Provider  acetaminophen (TYLENOL) 160 MG/5ML solution Take 10.2-20.3 mLs (325-650 mg total) by mouth every 4 (four) hours as needed for moderate pain (severe pain). 05/03/15   [name], MD  BIOTIN PO Take 1 tablet by mouth daily.    [provider]  calcium carbonate (OS-CAL - DOSED IN MG OF ELEMENTAL CALCIUM) 1250 MG tablet Take 1 tablet by mouth daily.      [provider]  cholecalciferol (VITAMIN D) 1000 UNITS tablet Take 1,000 Units by mouth daily.      [provider]  dexlansoprazole (DEXILANT) 60 MG capsule Take 1 capsule (60 mg total) by mouth daily. 09/20/16   Mauri Pole, MD  loperamide (IMODIUM) 2 MG capsule Take by mouth as needed for diarrhea or loose stools.    [provider]  Multiple Vitamin (MULTIVITAMIN WITH MINERALS) TABS tablet Take 1 tablet by mouth daily.    [provider]  NON FORMULARY OTC antacid- unsure of name    [provider]  ranitidine (ZANTAC) 300 MG tablet Take 1 tablet (300 mg total) by mouth at bedtime. 09/20/16   Mauri Pole, MD  sucralfate (CARAFATE) 1 g tablet TAKE 1 TABLET(1 GRAM) BY MOUTH TWICE DAILY 10/14/16    Lindsey, Venia Minks, MD  sucralfate (CARAFATE) 1 GM/10ML suspension Take 10 mLs (1 g total) by mouth 4 (four) times daily -  before meals and at bedtime. 09/20/16   Mauri Pole, MD  vitamin B-12 (CYANOCOBALAMIN) 500 MCG tablet Take 500 mcg by mouth daily.    [provider]    Family History Family History  Problem Relation Age of Onset  . Adopted: Yes  . Colon cancer Neg Hx     Social History Social History  Substance Use Topics  . Smoking status: Current Every Day Smoker    Packs/day: 0.25  . Smokeless tobacco: Never Used  . Alcohol use Yes     Comment: daiy     Allergies   Peach [prunus persica]; Peanut-containing drug products; Adhesive [tape]; Keflex [cephalexin]; Nsaids; Pollen extract; Shellfish-derived products; Bextra [valdecoxib]; Diclofenac; Latex; and Nickel   Review of Systems Review of Systems  Constitutional: Positive for chills, fatigue and fever.  HENT: Negative for rhinorrhea, sneezing and sore throat.   Eyes:  Negative for visual disturbance.  Respiratory: Positive for shortness of breath. Negative for cough.   Cardiovascular: Positive for chest pain.  Gastrointestinal: Positive for diarrhea, nausea and vomiting. Negative for abdominal pain.  Genitourinary: Positive for dysuria and urgency. Negative for hematuria.  Musculoskeletal: Positive for myalgias. Negative for back pain and joint swelling.  Skin: Negative for rash.  Neurological: Negative for headaches.  Hematological: Does not bruise/bleed easily.  Psychiatric/Behavioral: Negative for confusion.    Physical Exam Updated Vital Signs BP 107/73 (BP Location: Right Arm)   Pulse (!) 102   Temp (!) 100.8 F (38.2 C) (Oral)   Resp 18   Ht 1.575 m (5\' 2" )   Wt 71.2 kg (157 lb)   SpO2 100%   BMI 28.72 kg/m   Physical Exam  Constitutional: She is oriented to person, place, and time. She appears well-developed and well-nourished. No distress.  HENT:  Head: Normocephalic.    Mouth/Throat: Oropharynx is clear and moist.  Eyes: Conjunctivae and EOM are normal. Pupils are equal, round, and reactive to light. No scleral icterus.  Neck: Neck supple.  Cardiovascular: Regular rhythm.  Tachycardia present.   Pulmonary/Chest: Effort normal. No respiratory distress. She has no wheezes. She has no rales.  RA 96%  Abdominal: Soft. Bowel sounds are decreased. There is no tenderness.  Musculoskeletal: Normal range of motion. She exhibits no edema.  Neurological: She is alert and oriented to person, place, and time. No cranial nerve deficit or sensory deficit. She exhibits normal muscle tone. Coordination normal.  Skin: Skin is warm and dry.  Psychiatric: She has a normal mood and affect.  Nursing note and vitals reviewed.  ED Treatments / Results  DIAGNOSTIC STUDIES:  Oxygen Saturation is 96% on RA, normal by my interpretation.    COORDINATION OF CARE:  6:56 PM Discussed treatment plan with pt at bedside and pt agreed to plan.   Labs (all labs ordered are listed, but only abnormal results are displayed) Labs Reviewed  COMPREHENSIVE METABOLIC PANEL - Abnormal; Notable for the following:       Result Value   Glucose, Bld 127 (*)    Total Bilirubin 1.5 (*)    All other components within normal limits  CBC WITH DIFFERENTIAL/PLATELET - Abnormal; Notable for the following:    WBC 12.0 (*)    Hemoglobin 16.6 (*)    HCT 47.2 (*)    MCH 34.6 (*)    Neutro Abs 11.1 (*)    Lymphs Abs 0.3 (*)    All other components within normal limits  URINALYSIS, ROUTINE W REFLEX MICROSCOPIC - Abnormal; Notable for the following:    Ketones, ur 40 (*)    All other components within normal limits  CULTURE, BLOOD (ROUTINE X 2)  CULTURE, BLOOD (ROUTINE X 2)  URINE CULTURE  PROTIME-INR  I-STAT CG4 LACTIC ACID, ED  I-STAT CG4 LACTIC ACID, ED   Results for orders placed or performed during the hospital encounter of 01/02/17  Comprehensive metabolic panel  Result Value Ref Range    Sodium 135 135 - 145 mmol/L   Potassium 3.8 3.5 - 5.1 mmol/L   Chloride 102 101 - 111 mmol/L   CO2 22 22 - 32 mmol/L   Glucose, Bld 127 (H) 65 - 99 mg/dL   BUN 10 6 - 20 mg/dL   Creatinine, Ser 0.72 0.44 - 1.00 mg/dL   Calcium 9.1 8.9 - 10.3 mg/dL   Total Protein 7.4 6.5 - 8.1 g/dL   Albumin 3.8 3.5 - 5.0  g/dL   AST 31 15 - 41 U/L   ALT 25 14 - 54 U/L   Alkaline Phosphatase 105 38 - 126 U/L   Total Bilirubin 1.5 (H) 0.3 - 1.2 mg/dL   GFR calc non Af Amer >60 >60 mL/min   GFR calc Af Amer >60 >60 mL/min   Anion gap 11 5 - 15  CBC with Differential  Result Value Ref Range   WBC 12.0 (H) 4.0 - 10.5 K/uL   RBC 4.80 3.87 - 5.11 MIL/uL   Hemoglobin 16.6 (H) 12.0 - 15.0 g/dL   HCT 47.2 (H) 36.0 - 46.0 %   MCV 98.3 78.0 - 100.0 fL   MCH 34.6 (H) 26.0 - 34.0 pg   MCHC 35.2 30.0 - 36.0 g/dL   RDW 12.6 11.5 - 15.5 %   Platelets 247 150 - 400 K/uL   Neutrophils Relative % 92 %   Neutro Abs 11.1 (H) 1.7 - 7.7 K/uL   Lymphocytes Relative 3 %   Lymphs Abs 0.3 (L) 0.7 - 4.0 K/uL   Monocytes Relative 4 %   Monocytes Absolute 0.5 0.1 - 1.0 K/uL   Eosinophils Relative 1 %   Eosinophils Absolute 0.1 0.0 - 0.7 K/uL   Basophils Relative 0 %   Basophils Absolute 0.0 0.0 - 0.1 K/uL  Protime-INR  Result Value Ref Range   Prothrombin Time 13.5 11.4 - 15.2 seconds   INR 1.03   Urinalysis, Routine w reflex microscopic  Result Value Ref Range   Color, Urine YELLOW YELLOW   APPearance CLEAR CLEAR   Specific Gravity, Urine 1.011 1.005 - 1.030   pH 6.0 5.0 - 8.0   Glucose, UA NEGATIVE NEGATIVE mg/dL   Hgb urine dipstick NEGATIVE NEGATIVE   Bilirubin Urine NEGATIVE NEGATIVE   Ketones, ur 40 (A) NEGATIVE mg/dL   Protein, ur NEGATIVE NEGATIVE mg/dL   Nitrite NEGATIVE NEGATIVE   Leukocytes, UA NEGATIVE NEGATIVE  I-Stat CG4 Lactic Acid, ED  Result Value Ref Range   Lactic Acid, Venous 1.20 0.5 - 1.9 mmol/L    EKG  EKG Interpretation  Date/Time:  Thursday January 02 2017 18:15:25  EDT Ventricular Rate:  122 PR Interval:    QRS Duration: 86 QT Interval:  323 QTC Calculation: 461 R Axis:   67 Text Interpretation:  Sinus tachycardia Ventricular premature complex Probable left atrial enlargement Confirmed by Lindsey Keller [phone]) on 01/02/2017 6:27:42 PM       Radiology Dg Chest Port 1 View  Result Date: 01/02/2017 CLINICAL DATA:  Fever cough and congestion EXAM: PORTABLE CHEST 1 VIEW COMPARISON:  04/03/2016, 03/09/2015 FINDINGS: No focal consolidation or effusion. Slight increased interstitial opacity in the left mid and lower lung. No pleural effusion. Stable cardiomediastinal silhouette. No pneumothorax. IMPRESSION: Slight increased interstitial opacity in the left mid and lower lung which may represent mild interstitial inflammatory infiltrate. No focal consolidation. Electronically Signed   By: Donavan Foil M.D.   On: 01/02/2017 19:21    Procedures Procedures (including critical care time)  Medications Ordered in ED Medications  piperacillin-tazobactam (ZOSYN) IVPB 3.375 g (not administered)  vancomycin (VANCOCIN) IVPB 750 mg/150 ml premix (not administered)  acetaminophen (TYLENOL) tablet 650 mg (650 mg Oral Given 01/02/17 2326)  acetaminophen (TYLENOL) tablet 650 mg (650 mg Oral Given 01/02/17 1813)  piperacillin-tazobactam (ZOSYN) IVPB 3.375 g (0 g Intravenous Stopped 01/02/17 1936)  vancomycin (VANCOCIN) IVPB 1000 mg/200 mL premix (0 mg Intravenous Stopped 01/02/17 2149)  sodium chloride 0.9 % bolus 1,000 mL (0 mLs  Intravenous Stopped 01/02/17 1903)  sodium chloride 0.9 % bolus 1,000 mL (0 mLs Intravenous Stopped 01/02/17 2149)  oseltamivir (TAMIFLU) capsule 75 mg (75 mg Oral Given 01/02/17 2355)     Initial Impression / Assessment and Plan / ED Course  I have reviewed the triage vital signs and the nursing notes.  Pertinent labs & imaging results that were available during my care of the patient were reviewed by me and considered in my medical decision making  (see chart for details).    CT chest without evidence of pneumonia. No evidence of pulmonary edema. Patient's vital signs have improved here. Stools with some slight tachycardia. Patient never was hypotensive. Patient overall feeling better. Symptoms or flulike in nature. We'll go ahead and treat the with Tamiflu.  No evidence urinary tract infection. Lactic acid is not elevated. No significant leukocytosis. White blood cell count is 12,000.  CT of the chest did not crossover but typed report was provided.  Patient with improved vital signs. Blood pressure very stable with systolics above 850. Feel the patient is stable for discharge home has a spouse to watch after her. This is probably a flulike illness. Patient has agreed to take Tamiflu.   Final Clinical Impressions(s) / ED Diagnoses   Final diagnoses:  Flu-like symptoms    New Prescriptions New Prescriptions   No medications on file   I personally performed the services described in this documentation, which was scribed in my presence. The recorded information has been reviewed and is accurate.      Lindsey Sorrow, MD 01/03/17 Dyann Kief

## 2017-01-02 NOTE — Progress Notes (Signed)
Pharmacy Antibiotic Note  Lindsey Keller is a 61 y.o. female admitted on 01/02/2017 with sepsis.  Pharmacy has been consulted for vancomycin and zosyn dosing. WBC elevated at 12, temperature 100.8, and LA 1.2. SCr 0.72 with estimated CrCl ~ 70.   Plan: Vancomycin 1g IV x1, then 750 mg IV q12hr  Zosyn 3.375g IV q8hr  Vancomycin trough at [facility] and as needed Monitor renal function, clinical picture, and culture data F/u length of therapy   Height: 5\' 2"  (157.5 cm) Weight: 157 lb (71.2 kg) IBW/kg (Calculated) : 50.1  Temp (24hrs), Avg:100.8 F (38.2 C), Min:100.8 F (38.2 C), Max:100.8 F (38.2 C)   Recent Labs Lab 01/02/17 1813 01/02/17 1829  WBC 12.0*  --   CREATININE 0.72  --   LATICACIDVEN  --  1.20    Estimated Creatinine Clearance: 69.1 mL/min (by C-G formula based on SCr of 0.72 mg/dL).    Allergies  Allergen Reactions  . Peach [Prunus Persica] Anaphylaxis and Itching    If not cooked  . Peanut-Containing Drug Products Anaphylaxis and Itching    Walnuts - if not cooked  . Adhesive [Tape] Itching  . Keflex [Cephalexin]     Body aches  . Nsaids   . Pollen Extract Other (See Comments)    Seasonal Pollen- sneezing, red eyes  . Shellfish-Derived Products Other (See Comments)    " only eyes itch if touched"  . Bextra [Valdecoxib] Swelling and Rash    Facial swelling  . Diclofenac Nausea Only and Other (See Comments)    Dizziness, rash  . Latex Itching  . Nickel Itching and Rash    Antimicrobials this admission: 6/7 Vanc >>  6/7 Zosyn >>   Dose adjustments this admission: n/a   Microbiology results: pending   Argie Ramming, PharmD Pharmacy Resident  Pager [phone] 01/02/17 7:11 PM

## 2017-01-02 NOTE — Discharge Instructions (Signed)
Continue take Tylenol for the fevers and bodyaches. Take the Tamiflu as directed. Return for any new or worse symptoms. Increase fluids is much as possible. If coughing congestion gets worse would recommend Mucinex DM over-the-counter.

## 2017-01-04 LAB — URINE CULTURE

## 2017-01-07 LAB — CULTURE, BLOOD (ROUTINE X 2)
CULTURE: NO GROWTH
CULTURE: NO GROWTH
SPECIAL REQUESTS: ADEQUATE
Special Requests: ADEQUATE

## 2017-01-23 ENCOUNTER — Ambulatory Visit: Payer: Self-pay | Admitting: General Surgery

## 2017-01-23 NOTE — H&P (Signed)
Shanoah Asbill Alegria 01/23/2017 11:46 AM Location: [facility] Patient #: [id number] DOB: Aug 19, 1955 Married / Language: Cleophus Molt / Race: White Female   History of Present Illness Randall Hiss M. Jennavie Martinek MD; 01/23/2017 12:51 PM) The patient is a 61 year old female presenting status-post bariatric surgery. She comes in for follow-up after undergoing laparoscopic Roux-en-Y gastric bypass with cholecystectomy and cholangiogram on May 01 2015. Her initial visit weight was 258 pounds. Her preoperative weight was 260 pounds. She was last seen 08/02/16. Her weight at that time was 162 pounds. She denies any nausea, vomiting, abdominal pain, constipation. She unfortunately has continued smoking. She also has ongoing issues with impressive heartburn. She underwent repeat upper endoscopy in Feb. . She was placed on Carafate twice a day and dexilant was added. She reports that she is taking her supplements as directed. Her liquid intake is not that great however she is eating quite well. She has also noticed over the past few months that her portion size has decreased in the sense that she gets fuller on a smaller amount of food. She is really not doing a lot of physical activity either.  She did end up in the emergency room in early June with flulike symptoms and fever. She was diagnosed with a viral illness. She did undergo CT imaging and she has questions about some of the interpretations. There is question of some type of aneurysm in the body of the description. There is also radiological findings of hiatal hernia  ROS - a complete 12 point ROS was performed and all systems negative except for what is mentioned in HPI   Problem List/Past Medical Randall Hiss M. Redmond Pulling, MD; 01/23/2017 12:51 PM) Candise Bowens ESOPHAGUS DETERMINED BY BIOPSY (K22.70)  DUMPING SYNDROME (K91.1)  HYPERCHOLESTEREMIA (E78.00)  MULTIPLE PULMONARY NODULES DETERMINED BY COMPUTED TOMOGRAPHY OF LUNG (R91.8)  OBESITY (BMI  30.0-34.9) (E66.9)  HIATAL HERNIA (K44.9)  TOBACCO USE (Z72.0)  GASTRIC BYPASS STATUS FOR OBESITY (X54.00)   Past Surgical History Randall Hiss M. Redmond Pulling, MD; 01/23/2017 12:51 PM) Oral Surgery  Breast Biopsy  Left. Hysterectomy (not due to cancer) - Partial  Foot Surgery  Right. Breast Mass; Local Excision  Left.  Diagnostic Studies History Randall Hiss M. Redmond Pulling, MD; 01/23/2017 12:51 PM) Mammogram  within last year Colonoscopy  1-5 years ago Pap Smear  >5 years ago  Allergies Malachy Moan, RMA; 01/23/2017 11:51 AM) Cephalexin *CEPHALOSPORINS*  Diclofenac *ANALGESICS - ANTI-INFLAMMATORY*  Bextra *ANALGESICS - ANTI-INFLAMMATORY*  Latex  Nickel  NSAIDs  Peanut (Diagnostic) *DIAGNOSTIC PRODUCTS*  Pollen Extracts *ALTERNATIVE MEDICINES*  PRUNUS PERSICA FRUIT  Shellfish-derived Products  Tape 1"X5yd *MEDICAL DEVICES AND SUPPLIES*  Valdecoxib *ANALGESICS - ANTI-INFLAMMATORY*  Allergies Reconciled   Medication History Randall Hiss M. Redmond Pulling, MD; 01/23/2017 12:54 PM) RaNITidine HCl (300MG  Tablet, Oral) Active. Sucralfate (1GM Tablet, Oral) Active. Dexilant (60MG  Capsule DR, Oral) Active. Loperamide HCl (Oral) Specific strength unknown - Active. Biotin Active. Cyanocobalamin (500MCG Tablet, Oral) Active. Calcium Carbonate (1250 (500 Ca)MG Tablet, Oral) Active. Vitamin D (Cholecalciferol) (1000UNIT Tablet, Oral) Active. Multivitamin Adult (Oral) Active. Medications Reconciled BuPROPion HCl (100MG  Tablet, 1 (one) Tablet Oral three times daily, Taken starting 01/23/2017) Active. (start 100mg  po bid x 3days then increase to 100mg  po tid)  Social History Randall Hiss M. Redmond Pulling, MD; 01/23/2017 12:51 PM) Alcohol use  Moderate alcohol use. Caffeine use  Coffee, Tea. Tobacco use  Former smoker. No drug use   Family History Randall Hiss M. Redmond Pulling, MD; 01/23/2017 12:51 PM) First Degree Relatives  Family history unknown   Pregnancy / Birth History (  Leighton Ruff. Redmond Pulling, MD;  01/23/2017 12:51 PM) Para  1 Age of [age]  [age] Contraceptive History  Oral contraceptives. Gravida  0 Irregular periods  Age at menarche  14 years.  Other Problems Randall Hiss M. Redmond Pulling, MD; 01/23/2017 12:51 PM) Breast Cancer  Depression  TYPE 2 DIABETES MELLITUS WITHOUT COMPLICATION, WITHOUT LONG-TERM CURRENT USE OF INSULIN (E11.9)  GASTROESOPHAGEAL REFLUX DISEASE, ESOPHAGITIS PRESENCE NOT SPECIFIED (K21.9)  ARTHRITIS OF KNEE (M17.10)  BACK PAIN, LUMBOSACRAL (M54.5)   Vitals Malachy Moan RMA; 01/23/2017 11:48 AM) 01/23/2017 11:46 AM Weight: 152.8 lb Height: 63in Body Surface Area: 1.72 m Body Mass Index: 27.07 kg/m  Temp.: 97.71F  Pulse: 65 (Regular)  BP: 110/70 (Sitting, Left Arm, Standard)       Physical Exam Randall Hiss M. Ashleigh Luckow MD; 01/23/2017 12:48 PM) General Mental Status-Alert. General Appearance-Consistent with stated age. Hydration-Well hydrated. Voice-Normal. Note: obese   Head and Neck Head-normocephalic, atraumatic with no lesions or palpable masses. Trachea-midline. Thyroid Gland Characteristics - normal size and consistency.  Eye Eyeball - Bilateral-Extraocular movements intact. Sclera/Conjunctiva - Bilateral-No scleral icterus.  ENMT Note: ears - normal external ears mouth - lips intact   Chest and Lung Exam Chest and lung exam reveals -quiet, even and easy respiratory effort with no use of accessory muscles and on auscultation, normal breath sounds, no adventitious sounds and normal vocal resonance. Inspection Chest Wall - Normal. Back - normal.  Breast - Did not examine.  Cardiovascular Cardiovascular examination reveals -normal heart sounds, regular rate and rhythm with no murmurs and normal pedal pulses bilaterally.  Abdomen Inspection Inspection of the abdomen reveals - No Hernias. Skin - Scar - Note: old transverse incision. Palpation/Percussion Palpation and Percussion of the abdomen  reveal - Soft, Non Tender, No Rebound tenderness, No Rigidity (guarding) and No hepatosplenomegaly. Auscultation Auscultation of the abdomen reveals - Bowel sounds normal.  Peripheral Vascular Upper Extremity Palpation - Pulses bilaterally normal.  Neurologic Neurologic evaluation reveals -alert and oriented x 3 with no impairment of recent or remote memory. Mental Status-Normal.  Neuropsychiatric The patient's mood and affect are described as -normal. Judgment and Insight-insight is appropriate concerning matters relevant to self.  Musculoskeletal Normal Exam - Left-Upper Extremity Strength Normal and Lower Extremity Strength Normal. Normal Exam - Right-Upper Extremity Strength Normal and Lower Extremity Strength Normal. Note: L knee crepitus   Lymphatic Head & Neck  General Head & Neck Lymphatics: Bilateral - Description - Normal. Axillary - Did not examine. Femoral & Inguinal - Did not examine.    Assessment & Plan Randall Hiss M. Ariah Mower MD; 01/23/2017 12:54 PM) GASTRIC BYPASS STATUS FOR OBESITY (Z98.84) Impression: She looks well. Her vital signs are stable. has ongoing severe heartburn issues - see below. However I told her she absolutely positively had to stop smoking if she expects to see any improvement in her heartburn if not she is at high risk for developing marginal ulcers due to cigarette usage. We discussed the importance of taking supplements. I also discussed the importance of regular physical activity. I reviewed her ER records and clarified with the radiology department for them to reinterpret her CT chest and I explained to her that the dictation that she had a aortic aneurysm was a dictation error in there is no radiological evidence of a blowout thoracic aortic aneurysm Current Plans Pt Education - EMW_bariatric followup You are being scheduled for surgery- Our schedulers will call you.  You should hear from our office's scheduling department within 5  working days about the location, date, and time  of surgery. We try to make accommodations for patient's preferences in scheduling surgery, but sometimes the OR schedule or the surgeon's schedule prevents [place] from making those accommodations.  If you have not heard from our office [phone]) in 5 working days, call the office and ask for your surgeon's nurse.  If you have other questions about your diagnosis, plan, or surgery, call the office and ask for your surgeon's nurse.  TYPE 2 DIABETES MELLITUS WITHOUT COMPLICATION, WITHOUT LONG-TERM CURRENT USE OF INSULIN (E11.9) Impression: No longer on metformin; improved. A1C from 7 to 5.7! GASTROESOPHAGEAL REFLUX DISEASE, ESOPHAGITIS PRESENCE NOT SPECIFIED (K21.9) Impression: I advised the patient to continue to work on smoking cessation. Because she is had a gastric bypass I advised the patient to open up her reflux medication capsule, mixed in and a small amount of water and had a drop of either Lyme juice or lemon juice. New studies indicate this is best for gastric bypass patients. see hiatal hernia discussion below ARTHRITIS OF KNEE (M17.10) BACK PAIN, LUMBOSACRAL (M54.5) TOBACCO USE (Z72.0) Impression: We again talked about her tobacco use. We discussed how this is not helping her reflux and is putting her at high risk for formation of a marginal ulcer. She was able to stop smoking in the past. She is requested Wellbutrin since this is helped her in the past. We will put her back on Wellbutrin. She is also given Neurosurgeon. Current Plans Pt Education - Smoking: Ways to Quit: tobacco Started BuPROPion HCl 100MG , 1 (one) Tablet three times daily, #90, 01/23/2017, Ref. x1. Local Order: start 100mg  po bid x 3days then increase to 100mg  po tid HIATAL HERNIA (K44.9) Impression: Her upper endoscopy showed a 2-3 cm hiatal hernia. I reviewed her CT the chest from the emergency room earlier this month and she definitely has a hiatal hernia.  Portions of her gastric pouch are above her diaphragm. It appears that her gastrojejunal anastomosis is at the level of the diaphragm inlet. Based on this along with her continued severe symptomatic reflux I recommended laparoscopic repair of her hiatal hernia. We discussed that this would involve a laparoscopic approach, taking down scar tissue around her hiatus, and reapproximating the left and right diaphragm muscle and reducing the pouch out of the mediastinum. I advised her that I think this will help with her reflux although I don't think it'll be a complete cure for her reflux but I anticipate that it'll help with her symptoms somewhat. In addition she reports a change in her sensation of fullness that she is getting fuller on a smaller amount of foods than she was 6 months ago and I think this may be because portions of her pouch are herniating into her chest. I had a prolonged discussion with the patient regarding risk and benefits including but not limited to bleeding, infection, injury to surrounding structures, gastrotomy, injury to the esophagus, blood clot formation, leak, cardiac and pulmonary events, hiatal hernia recurrence and the typical postoperative recovery course. I discussed this with my colleagues Dr. Hassell Done who agrees with this plan  Leighton Ruff. Redmond Pulling, MD, FACS General, Bariatric, & Minimally Invasive Surgery [facility], [place]

## 2017-02-18 NOTE — Patient Instructions (Signed)
[place]  02/18/2017   Your procedure is scheduled on: 02/26/2017    Report to [facility] Main  Entrance Take [place]  elevators to 3rd floor to  [place] at   1100 AM.    Call this number if you have problems the morning of surgery [phone]   Remember: ONLY 1 PERSON MAY GO WITH YOU TO SHORT STAY TO GET  READY MORNING OF YOUR SURGERY.  Do not eat food or drink liquids :After Midnight.     Take these medicines the morning of surgery with A SIP OF WATER: dexilant                                 You may not have any metal on your body including hair pins and              piercings  Do not wear jewelry, make-up, lotions, powders or perfumes, deodorant             Do not wear nail polish.  Do not shave  48 hours prior to surgery.                 Do not bring valuables to the hospital. [place].  Contacts, dentures or bridgework may not be worn into surgery.  Leave suitcase in the car. After surgery it may be brought to your room.                      Please read over the following fact sheets you were given: _____________________________________________________________________             [facility] - Preparing for Surgery Before surgery, you can play an important role.  Because skin is not sterile, your skin needs to be as free of germs as possible.  You can reduce the number of germs on your skin by washing with CHG (chlorahexidine gluconate) soap before surgery.  CHG is an antiseptic cleaner which kills germs and bonds with the skin to continue killing germs even after washing. Please DO NOT use if you have an allergy to CHG or antibacterial soaps.  If your skin becomes reddened/irritated stop using the CHG and inform your nurse when you arrive at Short Stay. Do not shave (including legs and underarms) for at least 48 hours prior to the first CHG shower.  You may shave your  face/neck. Please follow these instructions carefully:  1.  Shower with CHG Soap the night before surgery and the  morning of Surgery.  2.  If you choose to wash your hair, wash your hair first as usual with your  normal  shampoo.  3.  After you shampoo, rinse your hair and body thoroughly to remove the  shampoo.                           4.  Use CHG as you would any other liquid soap.  You can apply chg directly  to the skin and wash                       Gently with a scrungie or clean washcloth.  5.  Apply the CHG Soap to your body ONLY FROM THE NECK DOWN.   Do not use on face/ open                           Wound or open sores. Avoid contact with eyes, ears mouth and genitals (private parts).                       Wash face,  Genitals (private parts) with your normal soap.             6.  Wash thoroughly, paying special attention to the area where your surgery  will be performed.  7.  Thoroughly rinse your body with warm water from the neck down.  8.  DO NOT shower/wash with your normal soap after using and rinsing off  the CHG Soap.                9.  Pat yourself dry with a clean towel.            10.  Wear clean pajamas.            11.  Place clean sheets on your bed the night of your first shower and do not  sleep with pets. Day of Surgery : Do not apply any lotions/deodorants the morning of surgery.  Please wear clean clothes to the hospital/surgery center.  FAILURE TO FOLLOW THESE INSTRUCTIONS MAY RESULT IN THE CANCELLATION OF YOUR SURGERY PATIENT SIGNATURE_________________________________  NURSE SIGNATURE__________________________________  ________________________________________________________________________

## 2017-02-19 ENCOUNTER — Encounter (HOSPITAL_COMMUNITY)
Admission: RE | Admit: 2017-02-19 | Discharge: 2017-02-19 | Disposition: A | Discharge status: Home / Self Care | Payer: BLUE CROSS/BLUE SHIELD | Source: Ambulatory Visit | Attending: General Surgery | Admitting: General Surgery

## 2017-02-19 ENCOUNTER — Telehealth: Payer: Self-pay

## 2017-02-19 ENCOUNTER — Encounter (HOSPITAL_COMMUNITY): Payer: Self-pay

## 2017-02-19 DIAGNOSIS — K219 Gastro-esophageal reflux disease without esophagitis: Secondary | ICD-10-CM | POA: Diagnosis not present

## 2017-02-19 DIAGNOSIS — K449 Diaphragmatic hernia without obstruction or gangrene: Secondary | ICD-10-CM | POA: Diagnosis not present

## 2017-02-19 DIAGNOSIS — Z01812 Encounter for preprocedural laboratory examination: Secondary | ICD-10-CM | POA: Insufficient documentation

## 2017-02-19 HISTORY — DX: Anxiety disorder, unspecified: F41.9

## 2017-02-19 HISTORY — DX: Pain in left shoulder: M25.512

## 2017-02-19 LAB — CBC WITH DIFFERENTIAL/PLATELET
BASOS ABS: 0.1 10*3/uL (ref 0.0–0.1)
BASOS PCT: 1 %
Eosinophils Absolute: 0.3 10*3/uL (ref 0.0–0.7)
Eosinophils Relative: 5 %
HEMATOCRIT: 44.1 % (ref 36.0–46.0)
HEMOGLOBIN: 15.1 g/dL — AB (ref 12.0–15.0)
Lymphocytes Relative: 39 %
Lymphs Abs: 2.3 10*3/uL (ref 0.7–4.0)
MCH: 34.6 pg — ABNORMAL HIGH (ref 26.0–34.0)
MCHC: 34.2 g/dL (ref 30.0–36.0)
MCV: 101.1 fL — ABNORMAL HIGH (ref 78.0–100.0)
MONOS PCT: 6 %
Monocytes Absolute: 0.4 10*3/uL (ref 0.1–1.0)
NEUTROS ABS: 2.9 10*3/uL (ref 1.7–7.7)
NEUTROS PCT: 49 %
Platelets: 257 10*3/uL (ref 150–400)
RBC: 4.36 MIL/uL (ref 3.87–5.11)
RDW: 12.5 % (ref 11.5–15.5)
WBC: 5.8 10*3/uL (ref 4.0–10.5)

## 2017-02-19 LAB — COMPREHENSIVE METABOLIC PANEL
ALBUMIN: 3.7 g/dL (ref 3.5–5.0)
ALK PHOS: 99 U/L (ref 38–126)
ALT: 17 U/L (ref 14–54)
AST: 26 U/L (ref 15–41)
Anion gap: 7 (ref 5–15)
BILIRUBIN TOTAL: 1 mg/dL (ref 0.3–1.2)
BUN: 15 mg/dL (ref 6–20)
CALCIUM: 8.9 mg/dL (ref 8.9–10.3)
CO2: 27 mmol/L (ref 22–32)
Chloride: 106 mmol/L (ref 101–111)
Creatinine, Ser: 0.89 mg/dL (ref 0.44–1.00)
GFR calc Af Amer: >60 mL/min (ref >60)
GFR calc non Af Amer: >60 mL/min (ref >60)
GLUCOSE: 90 mg/dL (ref 65–99)
Potassium: 4.9 mmol/L (ref 3.5–5.1)
SODIUM: 140 mmol/L (ref 135–145)
TOTAL PROTEIN: 6.9 g/dL (ref 6.5–8.1)

## 2017-02-19 NOTE — Telephone Encounter (Signed)
Spoke with pt in lobby.  Pt states she does not wish to continue f/u with [facility] at this time as her most recent CT chest was normal per her surgeon (per pt).  Pt instructed to call back for any questions or concerns

## 2017-02-19 NOTE — Progress Notes (Signed)
01/03/17-EKG-EPIC  CT CHEST-01/27/17-EPIC

## 2017-02-21 ENCOUNTER — Other Ambulatory Visit: Payer: Self-pay | Admitting: Gastroenterology

## 2017-02-21 DIAGNOSIS — K2210 Ulcer of esophagus without bleeding: Secondary | ICD-10-CM

## 2017-02-25 ENCOUNTER — Ambulatory Visit: Payer: Self-pay | Admitting: General Surgery

## 2017-02-25 HISTORY — PX: OTHER SURGICAL HISTORY: SHX169

## 2017-02-26 ENCOUNTER — Encounter (HOSPITAL_COMMUNITY): Payer: Self-pay | Admitting: *Deleted

## 2017-02-26 ENCOUNTER — Encounter (HOSPITAL_COMMUNITY)
Admission: RE | Disposition: A | Discharge status: Home / Self Care | Payer: Self-pay | Source: Ambulatory Visit | Attending: General Surgery

## 2017-02-26 ENCOUNTER — Observation Stay (HOSPITAL_COMMUNITY)
Admission: RE | Admit: 2017-02-26 | Discharge: 2017-02-27 | Disposition: A | Discharge status: Home / Self Care | Payer: BLUE CROSS/BLUE SHIELD | Source: Ambulatory Visit | Attending: General Surgery | Admitting: General Surgery

## 2017-02-26 ENCOUNTER — Ambulatory Visit (HOSPITAL_COMMUNITY): Payer: BLUE CROSS/BLUE SHIELD | Admitting: Anesthesiology

## 2017-02-26 DIAGNOSIS — Z91048 Other nonmedicinal substance allergy status: Secondary | ICD-10-CM | POA: Insufficient documentation

## 2017-02-26 DIAGNOSIS — K449 Diaphragmatic hernia without obstruction or gangrene: Secondary | ICD-10-CM | POA: Diagnosis present

## 2017-02-26 DIAGNOSIS — G4700 Insomnia, unspecified: Secondary | ICD-10-CM | POA: Insufficient documentation

## 2017-02-26 DIAGNOSIS — E119 Type 2 diabetes mellitus without complications: Secondary | ICD-10-CM | POA: Diagnosis not present

## 2017-02-26 DIAGNOSIS — Z9884 Bariatric surgery status: Secondary | ICD-10-CM | POA: Insufficient documentation

## 2017-02-26 DIAGNOSIS — Z79899 Other long term (current) drug therapy: Secondary | ICD-10-CM | POA: Insufficient documentation

## 2017-02-26 DIAGNOSIS — M4800 Spinal stenosis, site unspecified: Secondary | ICD-10-CM | POA: Insufficient documentation

## 2017-02-26 DIAGNOSIS — F329 Major depressive disorder, single episode, unspecified: Secondary | ICD-10-CM | POA: Insufficient documentation

## 2017-02-26 DIAGNOSIS — K2270 Barrett's esophagus without dysplasia: Secondary | ICD-10-CM | POA: Insufficient documentation

## 2017-02-26 DIAGNOSIS — F17200 Nicotine dependence, unspecified, uncomplicated: Secondary | ICD-10-CM | POA: Diagnosis not present

## 2017-02-26 DIAGNOSIS — F419 Anxiety disorder, unspecified: Secondary | ICD-10-CM | POA: Diagnosis not present

## 2017-02-26 DIAGNOSIS — K660 Peritoneal adhesions (postprocedural) (postinfection): Secondary | ICD-10-CM | POA: Insufficient documentation

## 2017-02-26 DIAGNOSIS — Z91040 Latex allergy status: Secondary | ICD-10-CM | POA: Insufficient documentation

## 2017-02-26 DIAGNOSIS — Z6828 Body mass index (BMI) 28.0-28.9, adult: Secondary | ICD-10-CM | POA: Diagnosis not present

## 2017-02-26 DIAGNOSIS — M5136 Other intervertebral disc degeneration, lumbar region: Secondary | ICD-10-CM | POA: Diagnosis not present

## 2017-02-26 DIAGNOSIS — Z881 Allergy status to other antibiotic agents status: Secondary | ICD-10-CM | POA: Insufficient documentation

## 2017-02-26 DIAGNOSIS — M1990 Unspecified osteoarthritis, unspecified site: Secondary | ICD-10-CM | POA: Diagnosis not present

## 2017-02-26 DIAGNOSIS — Z853 Personal history of malignant neoplasm of breast: Secondary | ICD-10-CM | POA: Diagnosis not present

## 2017-02-26 DIAGNOSIS — L409 Psoriasis, unspecified: Secondary | ICD-10-CM | POA: Diagnosis not present

## 2017-02-26 DIAGNOSIS — Z91018 Allergy to other foods: Secondary | ICD-10-CM | POA: Insufficient documentation

## 2017-02-26 DIAGNOSIS — E669 Obesity, unspecified: Secondary | ICD-10-CM | POA: Diagnosis not present

## 2017-02-26 DIAGNOSIS — Z9049 Acquired absence of other specified parts of digestive tract: Secondary | ICD-10-CM | POA: Insufficient documentation

## 2017-02-26 DIAGNOSIS — M25512 Pain in left shoulder: Secondary | ICD-10-CM | POA: Diagnosis not present

## 2017-02-26 DIAGNOSIS — K219 Gastro-esophageal reflux disease without esophagitis: Secondary | ICD-10-CM | POA: Diagnosis not present

## 2017-02-26 DIAGNOSIS — Z90710 Acquired absence of both cervix and uterus: Secondary | ICD-10-CM | POA: Insufficient documentation

## 2017-02-26 DIAGNOSIS — K589 Irritable bowel syndrome without diarrhea: Secondary | ICD-10-CM | POA: Insufficient documentation

## 2017-02-26 DIAGNOSIS — G4720 Circadian rhythm sleep disorder, unspecified type: Secondary | ICD-10-CM | POA: Insufficient documentation

## 2017-02-26 DIAGNOSIS — Z91013 Allergy to seafood: Secondary | ICD-10-CM | POA: Insufficient documentation

## 2017-02-26 DIAGNOSIS — Z9109 Other allergy status, other than to drugs and biological substances: Secondary | ICD-10-CM | POA: Insufficient documentation

## 2017-02-26 DIAGNOSIS — J45909 Unspecified asthma, uncomplicated: Secondary | ICD-10-CM | POA: Diagnosis not present

## 2017-02-26 HISTORY — PX: HIATAL HERNIA REPAIR: SHX195

## 2017-02-26 HISTORY — PX: UPPER GI ENDOSCOPY: SHX6162

## 2017-02-26 HISTORY — PX: ESOPHAGOGASTRODUODENOSCOPY: SHX5428

## 2017-02-26 LAB — CBC WITH DIFFERENTIAL/PLATELET
BASOS ABS: 0 10*3/uL (ref 0.0–0.1)
BASOS PCT: 1 %
Eosinophils Absolute: 0.3 10*3/uL (ref 0.0–0.7)
Eosinophils Relative: 8 %
HEMATOCRIT: 42.1 % (ref 36.0–46.0)
HEMOGLOBIN: 14.5 g/dL (ref 12.0–15.0)
Lymphocytes Relative: 41 %
Lymphs Abs: 1.7 10*3/uL (ref 0.7–4.0)
MCH: 34.6 pg — ABNORMAL HIGH (ref 26.0–34.0)
MCHC: 34.4 g/dL (ref 30.0–36.0)
MCV: 100.5 fL — ABNORMAL HIGH (ref 78.0–100.0)
MONO ABS: 0.4 10*3/uL (ref 0.1–1.0)
Monocytes Relative: 9 %
NEUTROS ABS: 1.7 10*3/uL (ref 1.7–7.7)
NEUTROS PCT: 41 %
Platelets: 227 10*3/uL (ref 150–400)
RBC: 4.19 MIL/uL (ref 3.87–5.11)
RDW: 12.5 % (ref 11.5–15.5)
WBC: 4 10*3/uL (ref 4.0–10.5)

## 2017-02-26 LAB — COMPREHENSIVE METABOLIC PANEL
ALK PHOS: 83 U/L (ref 38–126)
ALT: 19 U/L (ref 14–54)
ANION GAP: 8 (ref 5–15)
AST: 23 U/L (ref 15–41)
Albumin: 3.5 g/dL (ref 3.5–5.0)
BILIRUBIN TOTAL: 0.4 mg/dL (ref 0.3–1.2)
BUN: 14 mg/dL (ref 6–20)
CALCIUM: 8.9 mg/dL (ref 8.9–10.3)
CO2: 26 mmol/L (ref 22–32)
Chloride: 107 mmol/L (ref 101–111)
Creatinine, Ser: 0.76 mg/dL (ref 0.44–1.00)
GFR calc non Af Amer: >60 mL/min (ref >60)
Glucose, Bld: 92 mg/dL (ref 65–99)
Potassium: 4.2 mmol/L (ref 3.5–5.1)
Sodium: 141 mmol/L (ref 135–145)
TOTAL PROTEIN: 6.8 g/dL (ref 6.5–8.1)

## 2017-02-26 LAB — GLUCOSE, CAPILLARY: GLUCOSE-CAPILLARY: 120 mg/dL — AB (ref 65–99)

## 2017-02-26 SURGERY — REPAIR, HERNIA, HIATAL, LAPAROSCOPIC
Anesthesia: General | Site: Abdomen

## 2017-02-26 MED ORDER — ACETAMINOPHEN 500 MG PO TABS
1000.0000 mg | ORAL_TABLET | ORAL | Status: AC
  Administered 2017-02-26: 1000 mg via ORAL
  Filled 2017-02-26: qty 2

## 2017-02-26 MED ORDER — MORPHINE SULFATE (PF) 2 MG/ML IV SOLN
1.0000 mg | INTRAVENOUS | Status: DC | PRN
  Administered 2017-02-27: 2 mg via INTRAVENOUS
  Filled 2017-02-26: qty 1

## 2017-02-26 MED ORDER — PHENYLEPHRINE 40 MCG/ML (10ML) SYRINGE FOR IV PUSH (FOR BLOOD PRESSURE SUPPORT)
PREFILLED_SYRINGE | INTRAVENOUS | Status: DC | PRN
  Administered 2017-02-26 (×5): 80 ug via INTRAVENOUS

## 2017-02-26 MED ORDER — POTASSIUM CHLORIDE IN NACL 20-0.9 MEQ/L-% IV SOLN
INTRAVENOUS | Status: DC
  Administered 2017-02-26: 20:00:00 via INTRAVENOUS
  Filled 2017-02-26 (×3): qty 1000

## 2017-02-26 MED ORDER — FENTANYL CITRATE (PF) 100 MCG/2ML IJ SOLN
INTRAMUSCULAR | Status: AC
  Administered 2017-02-26: 14:00:00
  Filled 2017-02-26: qty 4

## 2017-02-26 MED ORDER — LACTATED RINGERS IR SOLN
Status: DC | PRN
  Administered 2017-02-26: 1000 mL

## 2017-02-26 MED ORDER — PROPOFOL 10 MG/ML IV BOLUS
INTRAVENOUS | Status: DC | PRN
  Administered 2017-02-26: 200 mg via INTRAVENOUS

## 2017-02-26 MED ORDER — KETAMINE HCL 10 MG/ML IJ SOLN
INTRAMUSCULAR | Status: DC | PRN
  Administered 2017-02-26: 30 mg via INTRAVENOUS
  Administered 2017-02-26: 20 mg via INTRAVENOUS

## 2017-02-26 MED ORDER — LIDOCAINE 2% (20 MG/ML) 5 ML SYRINGE
INTRAMUSCULAR | Status: AC
  Filled 2017-02-26: qty 5

## 2017-02-26 MED ORDER — CHLORHEXIDINE GLUCONATE CLOTH 2 % EX PADS
6.0000 | MEDICATED_PAD | Freq: Once | CUTANEOUS | Status: DC
  Administered 2017-02-26: 6 via TOPICAL

## 2017-02-26 MED ORDER — 0.9 % SODIUM CHLORIDE (POUR BTL) OPTIME
TOPICAL | Status: DC | PRN
  Administered 2017-02-26: 1000 mL

## 2017-02-26 MED ORDER — CIPROFLOXACIN IN D5W 400 MG/200ML IV SOLN
400.0000 mg | INTRAVENOUS | Status: AC
  Administered 2017-02-26: 200 mg via INTRAVENOUS
  Administered 2017-02-26: 400 mg via INTRAVENOUS
  Filled 2017-02-26: qty 200

## 2017-02-26 MED ORDER — FENTANYL CITRATE (PF) 100 MCG/2ML IJ SOLN
25.0000 ug | INTRAMUSCULAR | Status: DC | PRN
  Administered 2017-02-26 (×3): 50 ug via INTRAVENOUS

## 2017-02-26 MED ORDER — LIDOCAINE 2% (20 MG/ML) 5 ML SYRINGE
INTRAMUSCULAR | Status: DC | PRN
  Administered 2017-02-26: 100 mg via INTRAVENOUS

## 2017-02-26 MED ORDER — FENTANYL CITRATE (PF) 100 MCG/2ML IJ SOLN
INTRAMUSCULAR | Status: DC | PRN
  Administered 2017-02-26 (×2): 50 ug via INTRAVENOUS

## 2017-02-26 MED ORDER — ROCURONIUM BROMIDE 10 MG/ML (PF) SYRINGE
PREFILLED_SYRINGE | INTRAVENOUS | Status: DC | PRN
  Administered 2017-02-26: 40 mg via INTRAVENOUS
  Administered 2017-02-26: 20 mg via INTRAVENOUS

## 2017-02-26 MED ORDER — BUPIVACAINE LIPOSOME 1.3 % IJ SUSP
20.0000 mL | Freq: Once | INTRAMUSCULAR | Status: AC
  Administered 2017-02-26: 20 mL
  Administered 2017-02-26: 266 mg
  Filled 2017-02-26: qty 20

## 2017-02-26 MED ORDER — DEXAMETHASONE SODIUM PHOSPHATE 10 MG/ML IJ SOLN
INTRAMUSCULAR | Status: DC | PRN
  Administered 2017-02-26: 10 mg via INTRAVENOUS

## 2017-02-26 MED ORDER — ONDANSETRON HCL 4 MG/2ML IJ SOLN
INTRAMUSCULAR | Status: AC
  Filled 2017-02-26: qty 2

## 2017-02-26 MED ORDER — ROCURONIUM BROMIDE 50 MG/5ML IV SOSY
PREFILLED_SYRINGE | INTRAVENOUS | Status: AC
  Filled 2017-02-26: qty 5

## 2017-02-26 MED ORDER — ACETAMINOPHEN 500 MG PO TABS
1000.0000 mg | ORAL_TABLET | Freq: Four times a day (QID) | ORAL | Status: DC
  Administered 2017-02-26 – 2017-02-27 (×3): 1000 mg via ORAL
  Filled 2017-02-26 (×3): qty 2

## 2017-02-26 MED ORDER — DEXAMETHASONE SODIUM PHOSPHATE 10 MG/ML IJ SOLN
INTRAMUSCULAR | Status: AC
  Filled 2017-02-26: qty 1

## 2017-02-26 MED ORDER — DIPHENHYDRAMINE HCL 50 MG/ML IJ SOLN: 12.5000 mg | Freq: Four times a day (QID) | INTRAMUSCULAR | Status: DC | PRN

## 2017-02-26 MED ORDER — LOPERAMIDE HCL 2 MG PO CAPS: 2.0000 mg | ORAL_CAPSULE | Freq: Two times a day (BID) | ORAL | Status: DC | PRN

## 2017-02-26 MED ORDER — KETAMINE HCL 10 MG/ML IJ SOLN
INTRAMUSCULAR | Status: AC
  Filled 2017-02-26: qty 1

## 2017-02-26 MED ORDER — SIMETHICONE 80 MG PO CHEW: 40.0000 mg | CHEWABLE_TABLET | Freq: Four times a day (QID) | ORAL | Status: DC | PRN

## 2017-02-26 MED ORDER — METHOCARBAMOL 500 MG PO TABS
500.0000 mg | ORAL_TABLET | Freq: Four times a day (QID) | ORAL | Status: DC | PRN
  Administered 2017-02-26 – 2017-02-27 (×2): 500 mg via ORAL
  Filled 2017-02-26 (×2): qty 1

## 2017-02-26 MED ORDER — GABAPENTIN 300 MG PO CAPS
300.0000 mg | ORAL_CAPSULE | Freq: Two times a day (BID) | ORAL | Status: DC
  Administered 2017-02-27: 300 mg via ORAL
  Filled 2017-02-26: qty 1

## 2017-02-26 MED ORDER — PROMETHAZINE HCL 25 MG/ML IJ SOLN: 12.5000 mg | Freq: Four times a day (QID) | INTRAMUSCULAR | Status: DC | PRN

## 2017-02-26 MED ORDER — SODIUM CHLORIDE 0.9 % IJ SOLN
INTRAMUSCULAR | Status: DC | PRN
  Administered 2017-02-26: 50 mL via INTRAVENOUS

## 2017-02-26 MED ORDER — BUPIVACAINE-EPINEPHRINE 0.25% -1:200000 IJ SOLN
INTRAMUSCULAR | Status: AC
  Filled 2017-02-26: qty 1

## 2017-02-26 MED ORDER — PHENYLEPHRINE HCL 10 MG/ML IJ SOLN
INTRAMUSCULAR | Status: AC
  Filled 2017-02-26: qty 1

## 2017-02-26 MED ORDER — HEPARIN SODIUM (PORCINE) 5000 UNIT/ML IJ SOLN
5000.0000 [IU] | Freq: Once | INTRAMUSCULAR | Status: AC
  Administered 2017-02-26: 5000 [IU] via SUBCUTANEOUS
  Filled 2017-02-26: qty 1

## 2017-02-26 MED ORDER — ONDANSETRON HCL 4 MG/2ML IJ SOLN
INTRAMUSCULAR | Status: DC | PRN
  Administered 2017-02-26: 4 mg via INTRAVENOUS

## 2017-02-26 MED ORDER — DIPHENHYDRAMINE HCL 12.5 MG/5ML PO ELIX: 12.5000 mg | ORAL_SOLUTION | Freq: Four times a day (QID) | ORAL | Status: DC | PRN

## 2017-02-26 MED ORDER — SODIUM CHLORIDE 0.9 % IJ SOLN
INTRAMUSCULAR | Status: AC
  Filled 2017-02-26: qty 50

## 2017-02-26 MED ORDER — PANTOPRAZOLE SODIUM 40 MG IV SOLR
40.0000 mg | Freq: Every day | INTRAVENOUS | Status: DC
  Administered 2017-02-27: 40 mg via INTRAVENOUS
  Filled 2017-02-26: qty 40

## 2017-02-26 MED ORDER — LACTATED RINGERS IV SOLN
INTRAVENOUS | Status: DC
  Administered 2017-02-26 (×2): via INTRAVENOUS

## 2017-02-26 MED ORDER — ONDANSETRON HCL 4 MG/2ML IJ SOLN: 4.0000 mg | Freq: Once | INTRAMUSCULAR | Status: DC | PRN

## 2017-02-26 MED ORDER — OXYCODONE HCL 5 MG PO TABS
5.0000 mg | ORAL_TABLET | ORAL | Status: DC | PRN
  Administered 2017-02-26 – 2017-02-27 (×2): 5 mg via ORAL
  Filled 2017-02-26 (×2): qty 1

## 2017-02-26 MED ORDER — MIDAZOLAM HCL 5 MG/5ML IJ SOLN
INTRAMUSCULAR | Status: DC | PRN
  Administered 2017-02-26: 2 mg via INTRAVENOUS

## 2017-02-26 MED ORDER — ONDANSETRON HCL 4 MG/2ML IJ SOLN: 4.0000 mg | Freq: Four times a day (QID) | INTRAMUSCULAR | Status: DC | PRN

## 2017-02-26 MED ORDER — ONDANSETRON 4 MG PO TBDP: 4.0000 mg | ORAL_TABLET | Freq: Four times a day (QID) | ORAL | Status: DC | PRN

## 2017-02-26 MED ORDER — BUPIVACAINE-EPINEPHRINE 0.25% -1:200000 IJ SOLN
INTRAMUSCULAR | Status: DC | PRN
  Administered 2017-02-26: 12 mL

## 2017-02-26 MED ORDER — PHENYLEPHRINE HCL 10 MG/ML IJ SOLN
INTRAVENOUS | Status: DC | PRN
  Administered 2017-02-26: 50 ug/min via INTRAVENOUS

## 2017-02-26 MED ORDER — ENOXAPARIN SODIUM 40 MG/0.4ML ~~LOC~~ SOLN: 40.0000 mg | SUBCUTANEOUS | Status: DC

## 2017-02-26 MED ORDER — LIDOCAINE 2% (20 MG/ML) 5 ML SYRINGE
INTRAMUSCULAR | Status: DC | PRN
  Administered 2017-02-26: 1.5 mg/kg/h via INTRAVENOUS

## 2017-02-26 MED ORDER — SUCCINYLCHOLINE CHLORIDE 200 MG/10ML IV SOSY
PREFILLED_SYRINGE | INTRAVENOUS | Status: DC | PRN
  Administered 2017-02-26: 120 mg via INTRAVENOUS

## 2017-02-26 MED ORDER — SCOPOLAMINE 1 MG/3DAYS TD PT72
MEDICATED_PATCH | TRANSDERMAL | Status: AC
  Filled 2017-02-26: qty 1

## 2017-02-26 MED ORDER — PHENYLEPHRINE 40 MCG/ML (10ML) SYRINGE FOR IV PUSH (FOR BLOOD PRESSURE SUPPORT)
PREFILLED_SYRINGE | INTRAVENOUS | Status: AC
  Filled 2017-02-26: qty 10

## 2017-02-26 MED ORDER — GABAPENTIN 300 MG PO CAPS
300.0000 mg | ORAL_CAPSULE | ORAL | Status: AC
  Administered 2017-02-26: 300 mg via ORAL
  Filled 2017-02-26: qty 1

## 2017-02-26 MED ORDER — MIDAZOLAM HCL 2 MG/2ML IJ SOLN
INTRAMUSCULAR | Status: AC
  Filled 2017-02-26: qty 2

## 2017-02-26 MED ORDER — FENTANYL CITRATE (PF) 100 MCG/2ML IJ SOLN
INTRAMUSCULAR | Status: AC
  Filled 2017-02-26: qty 2

## 2017-02-26 MED ORDER — PROPOFOL 10 MG/ML IV BOLUS
INTRAVENOUS | Status: AC
  Filled 2017-02-26: qty 20

## 2017-02-26 MED ORDER — SUGAMMADEX SODIUM 200 MG/2ML IV SOLN
INTRAVENOUS | Status: AC
  Filled 2017-02-26: qty 2

## 2017-02-26 MED ORDER — SUCCINYLCHOLINE CHLORIDE 200 MG/10ML IV SOSY
PREFILLED_SYRINGE | INTRAVENOUS | Status: AC
  Filled 2017-02-26: qty 10

## 2017-02-26 SURGICAL SUPPLY — 64 items
ADH SKN CLS APL DERMABOND .7 (GAUZE/BANDAGES/DRESSINGS) ×3
APL SKNCLS STERI-STRIP NONHPOA (GAUZE/BANDAGES/DRESSINGS)
APPLIER CLIP ROT 10 11.4 M/L (STAPLE)
APR CLP MED LRG 11.4X10 (STAPLE)
BENZOIN TINCTURE PRP APPL 2/3 (GAUZE/BANDAGES/DRESSINGS) IMPLANT
CABLE HIGH FREQUENCY MONO STRZ (ELECTRODE) ×2 IMPLANT
CLIP APPLIE ROT 10 11.4 M/L (STAPLE) IMPLANT
CLOSURE WOUND 1/2 X4 (GAUZE/BANDAGES/DRESSINGS)
DECANTER SPIKE VIAL GLASS SM (MISCELLANEOUS) ×5 IMPLANT
DERMABOND ADVANCED (GAUZE/BANDAGES/DRESSINGS) ×2
DERMABOND ADVANCED .7 DNX12 (GAUZE/BANDAGES/DRESSINGS) IMPLANT
DEVICE SUT QUICK LOAD TK 5 (STAPLE) ×4 IMPLANT
DEVICE SUT TI-KNOT TK 5X26 (MISCELLANEOUS) ×1 IMPLANT
DEVICE SUTURE ENDOST 10MM (ENDOMECHANICALS) ×5 IMPLANT
DEVICE TI KNOT TK5 (MISCELLANEOUS) ×1
DISSECTOR BLUNT TIP ENDO 5MM (MISCELLANEOUS) ×5 IMPLANT
DRAIN PENROSE 18X1/2 LTX STRL (DRAIN) ×3 IMPLANT
DRAIN PENROSE LF 8X20.3CM SIL (WOUND CARE) ×2 IMPLANT
ELECT L-HOOK LAP 45CM DISP (ELECTROSURGICAL)
ELECT PENCIL ROCKER SW 15FT (MISCELLANEOUS) IMPLANT
ELECT REM PT RETURN 15FT ADLT (MISCELLANEOUS) ×5 IMPLANT
ELECTRODE L-HOOK LAP 45CM DISP (ELECTROSURGICAL) ×3 IMPLANT
GLOVE BIO SURGEON STRL SZ7.5 (GLOVE) ×3 IMPLANT
GLOVE BIOGEL PI IND STRL 6.5 (GLOVE) IMPLANT
GLOVE BIOGEL PI IND STRL 7.0 (GLOVE) IMPLANT
GLOVE BIOGEL PI IND STRL 7.5 (GLOVE) IMPLANT
GLOVE BIOGEL PI IND STRL 8 (GLOVE) IMPLANT
GLOVE BIOGEL PI INDICATOR 6.5 (GLOVE) ×2
GLOVE BIOGEL PI INDICATOR 7.0 (GLOVE) ×2
GLOVE BIOGEL PI INDICATOR 7.5 (GLOVE) ×2
GLOVE BIOGEL PI INDICATOR 8 (GLOVE) ×2
GLOVE INDICATOR 8.0 STRL GRN (GLOVE) ×3 IMPLANT
GOWN STRL REUS W/TWL LRG LVL3 (GOWN DISPOSABLE) ×3 IMPLANT
GOWN STRL REUS W/TWL XL LVL3 (GOWN DISPOSABLE) ×17 IMPLANT
HOLDER FOLEY CATH W/STRAP (MISCELLANEOUS) ×2 IMPLANT
IRRIG SUCT STRYKERFLOW 2 WTIP (MISCELLANEOUS)
IRRIGATION SUCT STRKRFLW 2 WTP (MISCELLANEOUS) ×3 IMPLANT
KIT BASIN OR (CUSTOM PROCEDURE TRAY) ×5 IMPLANT
NS IRRIG 1000ML POUR BTL (IV SOLUTION) ×5 IMPLANT
QUICK LOAD TK 5 (STAPLE) ×4
SCISSORS LAP 5X45 EPIX DISP (ENDOMECHANICALS) ×5 IMPLANT
SET IRRIG TUBING LAPAROSCOPIC (IRRIGATION / IRRIGATOR) ×2 IMPLANT
SHEARS HARMONIC ACE PLUS 36CM (ENDOMECHANICALS) ×5 IMPLANT
SLEEVE ADV FIXATION 5X100MM (TROCAR) ×15 IMPLANT
STAPLER VISISTAT 35W (STAPLE) IMPLANT
STRIP CLOSURE SKIN 1/2X4 (GAUZE/BANDAGES/DRESSINGS) IMPLANT
SUT MNCRL AB 4-0 PS2 18 (SUTURE) ×2 IMPLANT
SUT SURGIDAC NAB ES-9 0 48 120 (SUTURE) ×20 IMPLANT
SUT VIC AB 4-0 SH 18 (SUTURE) ×3 IMPLANT
TIP INNERVISION DETACH 40FR (MISCELLANEOUS) IMPLANT
TIP INNERVISION DETACH 50FR (MISCELLANEOUS) IMPLANT
TIP INNERVISION DETACH 56FR (MISCELLANEOUS) IMPLANT
TIPS INNERVISION DETACH 40FR (MISCELLANEOUS)
TOWEL OR NON WOVEN STRL DISP B (DISPOSABLE) ×2 IMPLANT
TRAY FOLEY CATH SILVER 16FR LF (SET/KITS/TRAYS/PACK) ×2 IMPLANT
TRAY FOLEY W/METER SILVER 16FR (SET/KITS/TRAYS/PACK) ×5 IMPLANT
TRAY LAPAROSCOPIC (CUSTOM PROCEDURE TRAY) ×5 IMPLANT
TROCAR ADV FIXATION 11X100MM (TROCAR) IMPLANT
TROCAR ADV FIXATION 12X100MM (TROCAR) ×2 IMPLANT
TROCAR ADV FIXATION 5X100MM (TROCAR) ×5 IMPLANT
TROCAR BLADELESS OPT 5 100 (ENDOMECHANICALS) ×5 IMPLANT
TROCAR XCEL BLUNT TIP 100MML (ENDOMECHANICALS) IMPLANT
TROCAR XCEL NON-BLD 11X100MML (ENDOMECHANICALS) IMPLANT
TUBING INSUF HEATED (TUBING) ×5 IMPLANT

## 2017-02-26 NOTE — Op Note (Signed)
ADITHI GAMMON [id number] July 29, 1956 02/26/2017  Preoperative diagnosis: hiatal hernia involving the gastric pouch of roux Y gastric bypass  Postoperative diagnosis: Same   Procedure: Upper endoscopy   Surgeon: Catalina Antigua B. Hassell Done  M.D., FACS   Anesthesia: Gen.   Indications for procedure: This patient was undergoing a hiatal hernia repair for symptomatic GERD after gastric bypass.    Description of procedure: The endoscopy was placed in the mouth and into the oropharynx and under endoscopic vision it was advanced to the esophagogastric junction.  The pouch was insufflated and was small.  The Struthers looked good with no ulcers.  The distal esophagus had healed and there was no esophagitis noted.  The EG junction was noted to be below the diaphragm.   No bleeding or leaks were detected.  The scope was withdrawn without difficulty.     Matt B. Hassell Done, MD, FACS General, Bariatric, & Minimally Invasive Surgery [facility], [place]

## 2017-02-26 NOTE — Anesthesia Procedure Notes (Signed)
Procedure Name: Intubation Date/Time: 02/26/2017 1:00 PM Performed by: Lind Covert Pre-anesthesia Checklist: Patient identified, Emergency Drugs available, Suction available, Patient being monitored and Timeout performed Patient Re-evaluated:Patient Re-evaluated prior to induction Oxygen Delivery Method: Circle system utilized Preoxygenation: Pre-oxygenation with 100% oxygen Induction Type: IV induction Laryngoscope Size: Mac and 4 Grade View: Grade I Tube type: Oral Tube size: 7.0 mm Number of attempts: 1 Airway Equipment and Method: Stylet Placement Confirmation: ETT inserted through vocal cords under direct vision,  positive ETCO2 and breath sounds checked- equal and bilateral Secured at: 20 cm Tube secured with: Tape Dental Injury: Teeth and Oropharynx as per pre-operative assessment

## 2017-02-26 NOTE — Anesthesia Preprocedure Evaluation (Addendum)
Anesthesia Evaluation  Patient identified by MRN, date of birth, ID band Patient awake    Reviewed: Allergy & Precautions, NPO status , Patient's Chart, lab work & pertinent test results  History of Anesthesia Complications (+) history of anesthetic complications  Airway Mallampati: II  TM Distance: >3 FB Neck ROM: Full    Dental  (+) Teeth Intact, Dental Advisory Given   Pulmonary asthma , Current Smoker,    Pulmonary exam normal breath sounds clear to auscultation       Cardiovascular negative cardio ROS Normal cardiovascular exam Rhythm:Regular Rate:Normal     Neuro/Psych PSYCHIATRIC DISORDERS Anxiety Depression negative neurological ROS     GI/Hepatic Neg liver ROS, GERD  Medicated,S/p Roux-en-y Barrett's esophagus   Endo/Other  diabetes  Renal/GU negative Renal ROS     Musculoskeletal negative musculoskeletal ROS (+) Arthritis ,   Abdominal   Peds  Hematology negative hematology ROS (+)   Anesthesia Other Findings Day of surgery medications reviewed with the patient.  Reproductive/Obstetrics                            Anesthesia Physical Anesthesia Plan  ASA: III  Anesthesia Plan: General   Post-op Pain Management:    Induction: Intravenous  PONV Risk Score and Plan: 4 or greater and Ondansetron, Dexamethasone, Midazolam and Scopolamine patch - Pre-op  Airway Management Planned: Oral ETT  Additional Equipment:   Intra-op Plan:   Post-operative Plan: Extubation in OR  Informed Consent: I have reviewed the patients History and Physical, chart, labs and discussed the procedure including the risks, benefits and alternatives for the proposed anesthesia with the patient or authorized representative who has indicated his/her understanding and acceptance.   Dental advisory given  Plan Discussed with: CRNA and Anesthesiologist  Anesthesia Plan Comments: (Risks/benefits of  general anesthesia discussed with patient including risk of damage to teeth, lips, gum, and tongue, nausea/vomiting, allergic reactions to medications, and the possibility of heart attack, stroke and death.  All patient questions answered.  Patient wishes to proceed.)       Anesthesia Quick Evaluation

## 2017-02-26 NOTE — Transfer of Care (Signed)
Immediate Anesthesia Transfer of Care Note  Patient: Lindsey Keller  Procedure(s) Performed: Procedure(s): LAPAROSCOPIC REPAIR OF HIATAL HERNIA (N/A) ESOPHAGOGASTRODUODENOSCOPY (EGD) (N/A) UPPER GI ENDOSCOPY  Patient Location: PACU  Anesthesia Type:General  Level of Consciousness: sedated  Airway & Oxygen Therapy: Patient Spontanous Breathing and Patient connected to face mask oxygen  Post-op Assessment: Report given to RN and Post -op Vital signs reviewed and stable  Post vital signs: Reviewed and stable  Last Vitals:  Vitals:   02/26/17 1113  BP: 97/65  Pulse: 64  Resp: 16  Temp: 36.9 C    Last Pain:  Vitals:   02/26/17 1113  TempSrc: Oral      Patients Stated Pain Goal: 3 (54/98/26 4158)  Complications: No apparent anesthesia complications

## 2017-02-26 NOTE — Op Note (Signed)
Lindsey Keller, Lindsey Keller           ACCOUNT NO.:  [id number]  MEDICAL RECORD NO.:  [id number]  LOCATION:                                 FACILITY:  PHYSICIAN:  Leighton Ruff. Redmond Pulling, MD, FACSDATE OF BIRTH:  07-07-1956  DATE OF PROCEDURE:  02/26/2017 DATE OF DISCHARGE:                              OPERATIVE REPORT   PREOPERATIVE DIAGNOSES: 1. Gastroesophageal reflux disease. 2. Hiatal hernia. 3. 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  POSTOPERATIVE DIAGNOSES: 1. Gastroesophageal reflux disease. 2. Hiatal hernia. 3. 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  PROCEDURE: 1. Laparoscopic repair of hiatal hernia. 2. Upper endoscopy.  SURGEON:  Leighton Ruff. Redmond Pulling, MD, FACS.  ASSISTANT SURGEON:  Isabel Caprice. Hassell Done, MD.  ANESTHESIA:  General.  ESTIMATED BLOOD LOSS:  Minimal.  FINDINGS:  The patient had a decent size gap between her left and right crura of her diaphragm.  The pouch was extending somewhat above the diaphragm.  After mobilization, the pouch was entirely within the abdominal cavity.  INDICATIONS FOR PROCEDURE:  The patient is a 61 year old female who underwent laparoscopic Roux-en-Y gastric bypass surgery in October 2016. Her comorbidities at that time included morbid obesity, arthritis of the knee, Barrett esophagus, diabetes type 2, chronic low back pain, and hypercholesterolemia.  She has actually done quite well with respect to weight loss and resolution of her comorbidities with exception of reflux.  Unfortunately, for the past year or so, she has had worsening reflux despite PPI administration.  The patient had returned and resumed smoking.  I ended up referring her to Gastroenterology and she was found to have severe reflux on upper endoscopy despite PPI and Carafate administration.  Imaging revealed concerns for hiatal hernia.  Her pouch and staple line were essentially at the level of her diaphragm.  Based on this,  I recommended diagnostic laparoscopy with repair of hiatal hernia.  The hope was that by reducing her pouch completely into her abdomen and plicating the left and right crura, this would help with her reflux.  We did discuss that there is a possibility it may not.  We also who discussed at length the risks and benefits of the procedure, otherwise.  DESCRIPTION OF PROCEDURE:  After obtaining informed consent, the patient was brought to the operating room and placed supine on the operating room table.  General endotracheal anesthesia was established.  Her abdomen was prepped and draped in the usual standard surgical fashion with ChloraPrep.  She received IV Cipro due to her drug allergies.  She had received the ERAS protocol medications.  A surgical time-out was performed.  I elected to gain access to her abdomen using the Optiview technique. Using an old trocar site, I made a small incision in the left subcostal space.  Then, using a 0 degree 5-mm laparoscope, I advanced it through all layers of the abdominal wall and into the abdominal cavity. Pneumoperitoneum was smoothly established up to a patient pressure of 15 mmHg without any change in vital signs.  The abdominal cavity was surveilled.  There was no evidence of injury to surrounding structures. The patient was placed in reverse Trendelenburg.  A TAP block was placed in the left  and right lateral abdominal walls using Exparel using laparoscopic guidance.  I then placed two 5 mm trocars in the right abdomen under direct visualization and 1 just above and to the left of the umbilicus and 1 lateral in the left lateral upper abdominal wall. Nathanson liver retractor was placed to lift up the left side of the liver.  This exposed a little bit of thin wispy adhesion between the Roux limb and the undersurface of the left lobe, which was taken down sharply with EndoShears.  At this point, Dr. Hassell Done had joined me in the operating room.   It was obvious that she had a dimple anteriorly at her diaphragm.  We then incised the gastrohepatic ligament with Harmonic Scalpel.  The right crus of the diaphragm was identified and gentle blunt dissection was commenced with aid of a Kittner.  The left crus of the diaphragm was identified.  At this point, we were able to reduce the pouch in its entirety.  This revealed a decent gap between her left and right crura along with the decent size space.  We did mobilize the left crus as well and got it free of scar tissue, taking care not to injure the undersurface of the pouch or the esophagus.  At this point, the left and right crura of the diaphragm were reapproximated using interrupted 0 Surgidac sutures using an Endo Stitch, secured with a Titanium Ti-Knot. Four sutures were placed.  This reapproximated the crura quite well. There was just a tiny small gap left.  We felt that any additional sutures would potentially result in too tight of her repair.  At this point, Dr. Hassell Done scrubbed out and performed an upper endoscopy.  It should be noted that her mucosa actually looked improved compared to her last endoscopy done in February of this year.  There was still evidence of reflux changes, however, the severe grade D reflux changes have significantly improved.  We were able to confirm that the pouch was entirely below the level of the diaphragm.  The anastomosis was patent and the pouch was of normal size, it did not appear to be a large pouch. Both the afferent and efferent limbs were easily cannulated of the Roux limb.  The pouch was decompressed and the endoscope was removed.  I did run her Roux limb all the way down to her jejunojejunostomy and that appeared grossly normal.  No evidence of internal hernia.  At this point, remaining Exparel was infiltrated in the preperitoneal space. Nathanson liver retractor was removed and the trocars were removed and pneumoperitoneum was released.   It should be noted that the right mid abdominal 5 mm trocar was upsized to a 12-mm trocar to accommodate the Endo Stitch.  The fascia in this location was closed with interrupted 0 Vicryl.  All skin incisions were closed with a 4-0 Monocryl in a subcuticular fashion followed by application of Dermabond.  All needle, instrument, and sponge counts were correct x2.  There were no immediate complications.  The patient tolerated the procedure well.  She was extubated and taken to the recovery room in stable condition.     Leighton Ruff. Redmond Pulling, MD, FACS     EMW/MEDQ  D:  02/26/2017  T:  02/26/2017  Job:  [id number]

## 2017-02-26 NOTE — Progress Notes (Signed)
Patient arrived to unit from PACU to room 1530.  Report given at bedside.  Patient has foley and six lap sites to abdomen.  2L [place].  No skin issues noted.  No other issues.

## 2017-02-26 NOTE — H&P (Signed)
Lindsey Keller is an 61 y.o. female.   Chief Complaint: here for surgery  HPI: she patient is a 61 year old female presenting revisional hiatal hernia surgery. She underwent laparoscopic Roux-en-Y gastric bypass with cholecystectomy and cholangiogram on May 01 2015. Her initial visit weight was 258 pounds. Her preoperative weight was 260 pounds. She was last seen 01/23/17. Her weight at that time was 162 pounds. She denies any nausea, vomiting, abdominal pain, constipation. She unfortunately has continued smoking. She also has ongoing issues with impressive heartburn. She underwent repeat upper endoscopy in Feb. . She was placed on Carafate twice a day and dexilant was added. She reports that she is taking her supplements as directed. Her liquid intake is not that great however she is eating quite well. She has also noticed over the past few months that her portion size has decreased in the sense that she gets fuller on a smaller amount of food. She is really not doing a lot of physical activity either.  She did end up in the emergency room in early June with flulike symptoms and fever. She was diagnosed with a viral illness. She did undergo CT imaging and she has questions about some of the interpretations. There is question of some type of aneurysm in the body of the description. There is also radiological findings of hiatal hernia  ROS - a complete 12 point ROS was performed and all systems negative except for what is mentioned in HPI  She states she has stopped smoking since her last office visit.   Past Medical History:  Diagnosis Date  . Allergy   . Anxiety   . Arthritis   . Asthma    INFREQUENT PROBLEM - NO INHALER CURRENTLY  . Barrett's esophagus   . Breast cancer ([place])    BREAST CANCER - L LUMPECTOMY - RADIATION, no lymph node involvement  . Complication of anesthesia    INSOMNIA / NIGHTMARES AFTER HYSTERECTOMY X 6 MONTHS, hyper   . DDD (degenerative disc  disease), lumbar   . Depression   . Diabetes mellitus without complication ([place])    pt lost 50pds not a DM anymore  not on meds in 3 years   . GERD (gastroesophageal reflux disease)   . Glucose intolerance (pre-diabetes)   . IBS (irritable bowel syndrome)   . Left shoulder pain    chronic after gastric bypass surgery  . Obesity   . Other psoriasis    involving hands only  . Sleep - wake disorder   . Spinal stenosis   . Status post dilation of esophageal narrowing     Past Surgical History:  Procedure Laterality Date  . ABDOMINAL HYSTERECTOMY    . BREAST LUMPECTOMY  2005   LUMPECTOMY-CANCER  . BUNIONECTOMY Left    L FOOT  . CHOLECYSTECTOMY N/A 05/01/2015   Procedure: LAPAROSCOPIC CHOLECYSTECTOMY WITH INTRAOPERATIVE CHOLANGIOGRAM;  Surgeon: [name], MD;  Location: [facility];  Service: General;  Laterality: N/A;  . COLONOSCOPY    . DIAGNOSTIC LAPAROSCOPY     FOR ENDOMETRIOSIS  . LAPAROSCOPIC ROUX-EN-Y GASTRIC BYPASS WITH HIATAL HERNIA REPAIR N/A 05/01/2015   Procedure: LAPAROSCOPIC ROUX-EN-Y GASTRIC BYPASS WITH HIATAL HERNIA REPAIR-POSSIBLE CHOLECYSTECTOMY;  Surgeon: [name], MD;  Location: [facility];  Service: General;  Laterality: N/A;  . PARTIAL HYSTERECTOMY     no salpingo-oophorectomy  . PILONIDAL CYST EXCISION    . UPPER GASTROINTESTINAL ENDOSCOPY    . uti    . UTI  02/25/2017  . WISDOM TOOTH  EXTRACTION      Family History  Problem Relation Age of Onset  . Adopted: Yes  . Colon cancer Neg Hx    Social History:  reports that she has been smoking.  She has been smoking about 0.25 packs per day. She has never used smokeless tobacco. She reports that she drinks alcohol. She reports that she does not use drugs.  Allergies:  Allergies  Allergen Reactions  . Macrobid [Nitrofurantoin Macrocrystal]     Fever, hypotension,shakes   . Peach [Prunus Persica] Anaphylaxis and Itching    If not cooked  . Peanut-Containing Drug Products Anaphylaxis and Itching    Walnuts - if  not cooked  . Adhesive [Tape] Itching  . Keflex [Cephalexin]     Body aches  . Nsaids Other (See Comments)    Gastric bypass patient & cannot take   . Pollen Extract Other (See Comments)    Seasonal Pollen- sneezing, red eyes  . Shellfish-Derived Products Other (See Comments)    " only eyes itch if touched"  . Bextra [Valdecoxib] Swelling and Rash    Facial swelling  . Diclofenac Nausea Only and Other (See Comments)    Dizziness, rash  . Latex Itching  . Nickel Itching and Rash    Facility-Administered Medications Prior to Admission  Medication Dose Route Frequency Provider Last Rate Last Dose  . 0.9 %  sodium chloride infusion  500 mL Intravenous Continuous Nandigam, Kavitha V, MD      . 0.9 %  sodium chloride infusion  500 mL Intravenous Continuous Nandigam, Venia Minks, MD       Medications Prior to Admission  Medication Sig Dispense Refill  . acetaminophen (TYLENOL) 160 MG/5ML solution Take 10.2-20.3 mLs (325-650 mg total) by mouth every 4 (four) hours as needed for moderate pain (severe pain). 120 mL 0  . calcium carbonate (OS-CAL) 1250 (500 Ca) MG chewable tablet Chew 1 tablet by mouth daily.    . Cyanocobalamin (VITAMIN B-12) 1000 MCG SUBL Place 1,000 mcg under the tongue daily.    Marland Kitchen DEXILANT 60 MG capsule TAKE 1 CAPSULE(60 MG) BY MOUTH DAILY 30 capsule 0  . flintstones complete (FLINTSTONES) 60 MG chewable tablet Chew 1 tablet by mouth 2 (two) times daily.    . Hydrocortisone-Aloe Vera (CORTIZONE-10 PLUS EX) Apply 1 application topically 2 (two) times daily as needed (FOR EZCEMA/DRY SKIN  ON ANKLE).    Marland Kitchen loperamide (IMODIUM) 2 MG capsule Take 2-4 mg by mouth 2 (two) times daily as needed for diarrhea or loose stools.     . sucralfate (CARAFATE) 1 g tablet TAKE 1 TABLET(1 GRAM) BY MOUTH TWICE DAILY (Patient taking differently: TAKE 1 TABLET(1 GRAM) BY MOUTH TWICE DAILY *MUST CRUSH TABLETS*) 60 tablet 0  . oseltamivir (TAMIFLU) 75 MG capsule Take 1 capsule (75 mg total) by mouth  every 12 (twelve) hours. (Patient not taking: Reported on 02/18/2017) 10 capsule 0  . ranitidine (ZANTAC) 300 MG tablet Take 1 tablet (300 mg total) by mouth at bedtime. (Patient not taking: Reported on 02/18/2017) 30 tablet 3  . sucralfate (CARAFATE) 1 GM/10ML suspension Take 10 mLs (1 g total) by mouth 4 (four) times daily -  before meals and at bedtime. (Patient not taking: Reported on 02/18/2017) 420 mL 3    Results for orders placed or performed during the hospital encounter of 02/26/17 (from the past 48 hour(s))  Comprehensive metabolic panel     Status: None   Collection Time: 02/26/17 11:22 AM  Result Value Ref  Range   Sodium 141 135 - 145 mmol/L   Potassium 4.2 3.5 - 5.1 mmol/L   Chloride 107 101 - 111 mmol/L   CO2 26 22 - 32 mmol/L   Glucose, Bld 92 65 - 99 mg/dL   BUN 14 6 - 20 mg/dL   Creatinine, Ser 0.76 0.44 - 1.00 mg/dL   Calcium 8.9 8.9 - 10.3 mg/dL   Total Protein 6.8 6.5 - 8.1 g/dL   Albumin 3.5 3.5 - 5.0 g/dL   AST 23 15 - 41 U/L   ALT 19 14 - 54 U/L   Alkaline Phosphatase 83 38 - 126 U/L   Total Bilirubin 0.4 0.3 - 1.2 mg/dL   GFR calc non Af Amer >60 >60 mL/min   GFR calc Af Amer >60 >60 mL/min    Comment: (NOTE) The eGFR has been calculated using the CKD EPI equation. This calculation has not been validated in all clinical situations. eGFR's persistently <60 mL/min signify possible Chronic Kidney Disease.    Anion gap 8 5 - 15  CBC with Differential/Platelet     Status: Abnormal   Collection Time: 02/26/17 11:22 AM  Result Value Ref Range   WBC 4.0 4.0 - 10.5 K/uL   RBC 4.19 3.87 - 5.11 MIL/uL   Hemoglobin 14.5 12.0 - 15.0 g/dL   HCT 42.1 36.0 - 46.0 %   MCV 100.5 (H) 78.0 - 100.0 fL   MCH 34.6 (H) 26.0 - 34.0 pg   MCHC 34.4 30.0 - 36.0 g/dL   RDW 12.5 11.5 - 15.5 %   Platelets 227 150 - 400 K/uL   Neutrophils Relative % 41 %   Neutro Abs 1.7 1.7 - 7.7 K/uL   Lymphocytes Relative 41 %   Lymphs Abs 1.7 0.7 - 4.0 K/uL   Monocytes Relative 9 %    Monocytes Absolute 0.4 0.1 - 1.0 K/uL   Eosinophils Relative 8 %   Eosinophils Absolute 0.3 0.0 - 0.7 K/uL   Basophils Relative 1 %   Basophils Absolute 0.0 0.0 - 0.1 K/uL   No results found.  Review of Systems  Constitutional: Negative for weight loss.  HENT: Negative for nosebleeds.   Eyes: Negative for blurred vision.  Respiratory: Negative for shortness of breath.   Cardiovascular: Negative for chest pain, palpitations, orthopnea and PND.       Denies DOE  Gastrointestinal: Positive for heartburn.  Genitourinary: Negative for dysuria and hematuria.  Musculoskeletal: Negative.   Skin: Negative for itching and rash.  Neurological: Negative for dizziness, focal weakness, seizures, loss of consciousness and headaches.       Denies TIAs, amaurosis fugax  Endo/Heme/Allergies: Does not bruise/bleed easily.  Psychiatric/Behavioral: The patient is not nervous/anxious.   All other systems reviewed and are negative.   Blood pressure 97/65, pulse 64, temperature 98.4 F (36.9 C), temperature source Oral, resp. rate 16, height _0  (1.575 m), weight 70.8 kg (156 lb), SpO2 95 %. Physical Exam  Vitals reviewed. Constitutional: She is oriented to person, place, and time. She appears well-developed and well-nourished. No distress.  HENT:  Head: Normocephalic and atraumatic.  Right Ear: External ear normal.  Left Ear: External ear normal.  Eyes: Conjunctivae are normal. No scleral icterus.  Neck: Normal range of motion. Neck supple. No tracheal deviation present. No thyromegaly present.  Cardiovascular: Normal rate and normal heart sounds.   Respiratory: Effort normal and breath sounds normal. No stridor. No respiratory distress. She has no wheezes.  GI: Soft. She exhibits no  distension. There is no tenderness. There is no rebound.  Musculoskeletal: She exhibits no edema or tenderness.  Lymphadenopathy:    She has no cervical adenopathy.  Neurological: She is alert and oriented to  person, place, and time. She exhibits normal muscle tone.  Skin: Skin is warm and dry. No rash noted. She is not diaphoretic. No erythema. No pallor.  Psychiatric: She has a normal mood and affect. Her behavior is normal. Judgment and thought content normal.     Assessment/Plan Reflux Barrett's esophagus H/o lap roux en y gastric bypass IBS Hiatal hernia  To OR for lap repair of hiatal hernia, possible upper endo  Risk and benefits previously discussed. We discussed that there is a possibility that this may not ameliorate her reflux issues but anatomically this is the only explanation that has been identified. We've previously discussed the risk and benefits including not limited to bleeding, infection, injury to surrounding structures, hiatal hernia recurrence, failure to ameliorate her symptoms, blood clot formation, cardiac and pulmonary events, as well as need for additional procedures as well as the typical postoperative course  All questions asked and answered  Leighton Ruff. Redmond Pulling, MD, [place] B[facility] Minimally In[facility], [place]     Gayland Curry, MD 02/26/2017, 12:20 PM

## 2017-02-26 NOTE — Brief Op Note (Signed)
02/26/2017  2:43 PM  PATIENT:  Suhaila Troiano Kujawa  61 y.o. female  PRE-OPERATIVE DIAGNOSIS:  GERD, hiatal hernia; h/o lap roux en y gastric bypass 04/2015  POST-OPERATIVE DIAGNOSIS:  GERD, hiatal hernia; h/o lap roux en y gastric bypass 04/2015  PROCEDURE:  Procedure(s): LAPAROSCOPIC REPAIR OF HIATAL HERNIA (N/A) ESOPHAGOGASTRODUODENOSCOPY (EGD) (N/A) UPPER GI ENDOSCOPY  SURGEON:  Surgeon(s) and Role:    [name], MD - Primary    * Johnathan Hausen, MD - Assisting  PHYSICIAN ASSISTANT:   ASSISTANTS: see above   ANESTHESIA:   General   EBL:  Total I/O In: 62 [I.V.:1900] Out: -   EBL: minimal   BLOOD ADMINISTERED:none  DRAINS: none   LOCAL MEDICATIONS USED:  OTHER exparel  SPECIMEN:  No Specimen  DISPOSITION OF SPECIMEN:  N/A  COUNTS:  YES  TOURNIQUET:  * No tourniquets in log *  DICTATION: .Other Dictation: Dictation Number [id number]  PLAN OF CARE: Admit for overnight observation  PATIENT DISPOSITION:  PACU - hemodynamically stable.   Delay start of Pharmacological VTE agent (>24hrs) due to surgical blood loss or risk of bleeding: no

## 2017-02-27 DIAGNOSIS — K449 Diaphragmatic hernia without obstruction or gangrene: Secondary | ICD-10-CM | POA: Diagnosis not present

## 2017-02-27 MED ORDER — OXYCODONE HCL 5 MG PO TABS: 5.0000 mg | ORAL_TABLET | ORAL | 0 refills | Status: AC | PRN

## 2017-02-27 NOTE — Discharge Summary (Signed)
Physician Discharge Summary  [name]:[id number] DOB: 1955-12-13 DOA: 02/26/2017  PCP: [name], MD  Admit date: 02/26/2017 Discharge date: 02/27/2017  Recommendations for Outpatient Follow-up:   Follow-up Information    [name], MD. Schedule an appointment as soon as possible for a visit in 3 week(s).   Specialty:  General Surgery Why:  for postop check Contact information: [address] [place] [address] [phone]          Discharge Diagnoses:  1. Hiatal hernia 2. GERD 3. H/o laparoscopic roux en y gastric bypass 4. Depression 5. H/o barrett's esophagus  Surgical Procedure: laparoscopic hiatal hernia repair, upper endoscopy  Discharge Condition: good Disposition: home  Diet recommendation: soft diet  Filed Weights   02/26/17 1122  Weight: 70.8 kg (156 lb)    History of present illness:   Lindsey Keller patient is a 61 year old female presenting revisional hiatal hernia surgery. Lindsey Keller underwent laparoscopic Roux-en-Y gastric bypass with cholecystectomy and cholangiogram on May 01 2015. Her initial visit weight was 258 pounds. Her preoperative weight was 260 pounds. Lindsey Keller was last seen 01/23/17. Her weight at that time was 162 pounds. Lindsey Keller denies any nausea, vomiting, abdominal pain, constipation. Lindsey Keller unfortunately has continued smoking. Lindsey Keller also has ongoing issues with impressive heartburn. Lindsey Keller underwent repeat upper endoscopy in Feb. . Lindsey Keller was placed on Carafate twice a day and dexilant was added. Lindsey Keller reports that Lindsey Keller is taking her supplements as directed. Her liquid intake is not that great however Lindsey Keller is eating quite well. Lindsey Keller has also noticed over the past few months that her portion size has decreased in the sense that Lindsey Keller gets fuller on a smaller amount of food. Lindsey Keller is really not doing a lot of physical activity either  Hospital Course:  Lindsey Keller came in for planned lap HHR. Lindsey Keller was kept overnight for observation. On POD 1  Lindsey Keller was tolerating a full liquid diet without n/v. Lindsey Keller was ambulating well.  Lindsey Keller reported Lindsey Keller didn't well which always happens with anesthesia along with b/l shoulder pain.  Lindsey Keller had voided. Her vitals were normal.  Her pain was well controlled.  We discussed dc instructions. Lindsey Keller was deemed stable for dc.  BP 137/83 (BP Location: Right Arm)   Pulse (!) 52   Temp 98.2 F (36.8 C) (Oral)   Resp 18   Ht 5\' 2"  (1.575 m)   Wt 70.8 kg (156 lb)   SpO2 97%   BMI 28.53 kg/m   Gen: alert, NAD, non-toxic appearing Pupils: equal, no scleral icterus Pulm: Lungs clear to auscultation, symmetric chest rise CV: regular rate and rhythm Abd: soft,  Min tender, nondistended.  No cellulitis. No incisional hernia Ext: no edema, no calf tenderness Skin: no rash, no jaundice    Discharge Instructions  Discharge Instructions    Call MD for:    Complete by:  As directed    Temperature >101   Call MD for:  hives    Complete by:  As directed    Call MD for:  persistant dizziness or light-headedness    Complete by:  As directed    Call MD for:  persistant nausea and vomiting    Complete by:  As directed    Call MD for:  redness, tenderness, or signs of infection (pain, swelling, redness, odor or green/yellow discharge around incision site)    Complete by:  As directed    Call MD for:  severe uncontrolled pain    Complete by:  As directed    Diet general    Complete by:  As directed    Soft diet   Discharge instructions    Complete by:  As directed    See [facility] discharge instructions   Increase activity slowly    Complete by:  As directed      Allergies as of 02/27/2017      Reactions   Macrobid [nitrofurantoin Macrocrystal]    Fever, hypotension,shakes    Peach [prunus Persica] Anaphylaxis, Itching   If not cooked   Peanut-containing Drug Products Anaphylaxis, Itching   Walnuts - if not cooked   Adhesive [tape] Itching   Keflex [cephalexin]    Body aches   Nsaids Other (See Comments)    Gastric bypass patient & cannot take    Pollen Extract Other (See Comments)   Seasonal Pollen- sneezing, red eyes   Shellfish-derived Products Other (See Comments)   " only eyes itch if touched"   Bextra [valdecoxib] Swelling, Rash   Facial swelling   Diclofenac Nausea Only, Other (See Comments)   Dizziness, rash   Latex Itching   Nickel Itching, Rash      Medication List    TAKE these medications   acetaminophen 160 MG/5ML solution Commonly known as:  TYLENOL Take 10.2-20.3 mLs (325-650 mg total) by mouth every 4 (four) hours as needed for moderate pain (severe pain).   calcium carbonate 1250 (500 Ca) MG chewable tablet Commonly known as:  OS-CAL Chew 1 tablet by mouth daily.   CORTIZONE-10 PLUS EX Apply 1 application topically 2 (two) times daily as needed (FOR EZCEMA/DRY SKIN  ON ANKLE).   DEXILANT 60 MG capsule Generic drug:  dexlansoprazole TAKE 1 CAPSULE(60 MG) BY MOUTH DAILY   flintstones complete 60 MG chewable tablet Chew 1 tablet by mouth 2 (two) times daily.   loperamide 2 MG capsule Commonly known as:  IMODIUM Take 2-4 mg by mouth 2 (two) times daily as needed for diarrhea or loose stools.   oseltamivir 75 MG capsule Commonly known as:  TAMIFLU Take 1 capsule (75 mg total) by mouth every 12 (twelve) hours.   oxyCODONE 5 MG immediate release tablet Commonly known as:  Oxy IR/ROXICODONE Take 1-2 tablets (5-10 mg total) by mouth every 4 (four) hours as needed for moderate pain.   ranitidine 300 MG tablet Commonly known as:  ZANTAC Take 1 tablet (300 mg total) by mouth at bedtime.   sucralfate 1 GM/10ML suspension Commonly known as:  CARAFATE Take 10 mLs (1 g total) by mouth 4 (four) times daily -  before meals and at bedtime. What changed:  Another medication with the same name was removed. Continue taking this medication, and follow the directions you see here.   Vitamin B-12 1000 MCG Subl Place 1,000 mcg under the tongue daily.      Follow-up  Information    [name], MD. Schedule an appointment as soon as possible for a visit in 3 week(s).   Specialty:  General Surgery Why:  for postop check Contact information: [address] [place] [address] [phone]            The results of significant diagnostics from this hospitalization (including imaging, microbiology, ancillary and laboratory) are listed below for reference.    Significant Diagnostic Studies: No results found.  Microbiology: No results found for this or any previous visit (from the past 240 hour(s)).   Labs: Basic Metabolic Panel:  Recent Labs Lab 02/26/17 1122  NA  141  K 4.2  CL 107  CO2 26  GLUCOSE 92  BUN 14  CREATININE 0.76  CALCIUM 8.9   Liver Function Tests:  Recent Labs Lab 02/26/17 1122  AST 23  ALT 19  ALKPHOS 83  BILITOT 0.4  PROT 6.8  ALBUMIN 3.5   No results for input(s): LIPASE, AMYLASE in the last 168 hours. No results for input(s): AMMONIA in the last 168 hours. CBC:  Recent Labs Lab 02/26/17 1122  WBC 4.0  NEUTROABS 1.7  HGB 14.5  HCT 42.1  MCV 100.5*  PLT 227   Cardiac Enzymes: No results for input(s): CKTOTAL, CKMB, CKMBINDEX, TROPONINI in the last 168 hours. BNP: BNP (last 3 results) No results for input(s): BNP in the last 8760 hours.  ProBNP (last 3 results) No results for input(s): PROBNP in the last 8760 hours.  CBG:  Recent Labs Lab 02/26/17 1447  GLUCAP 120*    Active Problems:   Hiatal hernia   Time coordinating discharge: 20 min  Signed:  Gayland Curry, MD [facility], [place] [phone] 02/27/2017, 7:58 AM

## 2017-02-27 NOTE — Discharge Instructions (Signed)
[facility] LAPAROSCOPIC [facility]: POST OP INSTRUCTIONS Always review your discharge instruction sheet given to you by the facility where your surgery was performed. IF YOU HAVE DISABILITY OR FAMILY LEAVE FORMS, YOU MUST BRING THEM TO THE OFFICE FOR PROCESSING.   DO NOT GIVE THEM TO YOUR DOCTOR.  1. A prescription for pain medication may be given to you upon discharge.  Take your pain medication as prescribed, if needed.  If narcotic pain medicine is not needed, then you may take acetaminophen (Tylenol) as needed. 2. Take your usually prescribed medications unless otherwise directed. 3. If you need a refill on your pain medication, please contact your pharmacy.  They will contact our office to request authorization. Prescriptions will not be filled after 5pm or on week-ends. 4. You should follow a light soft solid diet the first few days after arrival home, such as soups, protein shakes, etc.  Be sure to include lots of fluids daily. 5. Most patients will experience some swelling and bruising in the area of the incisions.  Ice packs will help.  Swelling and bruising can take several days to resolve.  6. It is common to experience some constipation if taking pain medication after surgery.  Increasing fluid intake and taking a stool softener (such as Colace) will usually help or prevent this problem from occurring.  A mild laxative (Milk of Magnesia or Miralax) should be taken according to package instructions if there are no bowel movements after 48 hours. 7.  If your surgeon used skin glue on the incision, you may shower in 24 hours.  The glue will flake off over the next 2-3 weeks.  Any sutures or staples will be removed at the office during your follow-up visit. 8. ACTIVITIES:  You may resume regular (light) daily activities beginning the next day--such as daily self-care, walking, climbing stairs--gradually increasing activities as tolerated.  You may have sexual intercourse  when it is comfortable.  Refrain from any heavy lifting or straining until approved by your doctor. a. You may drive when you are no longer taking prescription pain medication, you can comfortably wear a seatbelt, and you can safely maneuver your car and apply brakes. 9. You should see your doctor in the office for a follow-up appointment approximately 2-3 weeks after your surgery.  Make sure that you call for this appointment within a day or two after you arrive home to insure a convenient appointment time. 10. OTHER INSTRUCTIONS:  WHEN TO CALL YOUR DOCTOR: 1. Fever over 101.0 2. Inability to urinate 3. Continued bleeding from incision. 4. Increased pain, redness, or drainage from the incision. 5. Increasing abdominal pain  The clinic staff is available to answer your questions during regular business hours.  Please dont hesitate to call and ask to speak to one of the nurses for clinical concerns.  If you have a medical emergency, go to the nearest emergency room or call 911.  A surgeon from [facility] is always on call at the hospital. [address], [place], [place], [place]  [address] ? [address], [place], [place]   [address] [phone] ? [phone] ? FAX [phone] Web site: [email or web address] Soft-Food Meal Plan A soft-food meal plan includes foods that are safe and easy to swallow. This meal plan typically is used:  If you are having trouble chewing or swallowing foods.  As a transition meal plan after only having had liquid meals for a long period.  What do I need to  know about the soft-food meal plan? A soft-food meal plan includes tender foods that are soft and easy to chew and swallow. In most cases, bite-sized pieces of food are easier to swallow. A bite-sized piece is about  inch or smaller. Foods in this plan do not need to be ground or pureed. Foods that are very hard, crunchy, or sticky should be avoided. Also, breads, cereals,  yogurts, and desserts with nuts, seeds, or fruits should be avoided. What foods can I eat? Grains Rice and wild rice. Moist bread, dressing, pasta, and noodles. Well-moistened dry or cooked cereals, such as farina (cooked wheat cereal), oatmeal, or grits. Biscuits, breads, muffins, pancakes, and waffles that have been well moistened. Vegetables Shredded lettuce. Cooked, tender vegetables, including potatoes without skins. Vegetable juices. Broths or creamed soups made with vegetables that are not stringy or chewy. Strained tomatoes (without seeds). Fruits Canned or well-cooked fruits. Soft (ripe), peeled fresh fruits, such as peaches, nectarines, kiwi, cantaloupe, honeydew melon, and watermelon (without seeds). Soft berries with small seeds, such as strawberries. Fruit juices (without pulp). Meats and Other Protein Sources Moist, tender, lean beef. Mutton. Lamb. Veal. Chicken. [place]. Liver. Ham. Fish without bones. Eggs. Dairy Milk, milk drinks, and cream. Plain cream cheese and cottage cheese. Plain yogurt. Sweets/Desserts Flavored gelatin desserts. Custard. Plain ice cream, frozen yogurt, sherbet, milk shakes, and malts. Plain cakes and cookies. Plain hard candy. Other Butter, margarine (without trans fat), and cooking oils. Mayonnaise. Cream sauces. Mild spices, salt, and sugar. Syrup, molasses, honey, and jelly. The items listed above may not be a complete list of recommended foods or beverages. Contact your dietitian for more options. What foods are not recommended? Grains Dry bread, toast, crackers that have not been moistened. Coarse or dry cereals, such as bran, granola, and shredded wheat. Tough or chewy crusty breads, such as [place] bread or baguettes. Vegetables Corn. Raw vegetables except shredded lettuce. Cooked vegetables that are tough or stringy. Tough, crisp, fried potatoes and potato skins. Fruits Fresh fruits with skins or seeds or both, such as apples, pears, or grapes.  Stringy, high-pulp fruits, such as papaya, pineapple, coconut, or mango. Fruit leather, fruit roll-ups, and all dried fruits. Meats and Other Protein Sources Sausages and hot dogs. Meats with gristle. Fish with bones. Nuts, seeds, and chunky peanut or other nut butters. Sweets/Desserts Cakes or cookies that are very dry or chewy. The items listed above may not be a complete list of foods and beverages to avoid. Contact your dietitian for more information. This information is not intended to replace advice given to you by your health care provider. Make sure you discuss any questions you have with your health care provider. Document Released: 10/22/2007 Document Revised: 12/21/2015 Document Reviewed: 06/11/2013 Elsevier Interactive Patient Education  2017 [employer].

## 2017-02-27 NOTE — Progress Notes (Signed)
Discharge instructions discussed with patient, verbalized agreement and understanding, prescription given to patient 

## 2017-02-27 NOTE — Anesthesia Postprocedure Evaluation (Signed)
Anesthesia Post Note  Patient: Lindsey Keller  Procedure(s) Performed: Procedure(s) (LRB): LAPAROSCOPIC REPAIR OF HIATAL HERNIA (N/A) ESOPHAGOGASTRODUODENOSCOPY (EGD) (N/A) UPPER GI ENDOSCOPY     Patient location during evaluation: PACU Anesthesia Type: General Level of consciousness: awake and alert Pain management: pain level controlled Vital Signs Assessment: post-procedure vital signs reviewed and stable Respiratory status: spontaneous breathing, nonlabored ventilation, respiratory function stable and patient connected to nasal cannula oxygen Cardiovascular status: blood pressure returned to baseline and stable Postop Assessment: no signs of nausea or vomiting Anesthetic complications: no    Last Vitals:  Vitals:   02/27/17 0035 02/27/17 0558  BP: 128/77 137/83  Pulse: 61 (!) 52  Resp: 18 18  Temp: 36.9 C 36.8 C    Last Pain:  Vitals:   02/27/17 0658  TempSrc:   PainSc: 2                  Catalina Gravel

## 2017-02-28 NOTE — Progress Notes (Signed)
Thank you, hope this helps her

## 2017-03-03 ENCOUNTER — Other Ambulatory Visit: Payer: BLUE CROSS/BLUE SHIELD

## 2017-03-05 ENCOUNTER — Ambulatory Visit (HOSPITAL_BASED_OUTPATIENT_CLINIC_OR_DEPARTMENT_OTHER): Payer: BLUE CROSS/BLUE SHIELD

## 2017-03-10 ENCOUNTER — Ambulatory Visit: Payer: BLUE CROSS/BLUE SHIELD | Admitting: Oncology

## 2017-03-20 ENCOUNTER — Encounter (HOSPITAL_BASED_OUTPATIENT_CLINIC_OR_DEPARTMENT_OTHER): Payer: Self-pay | Admitting: Emergency Medicine

## 2017-03-20 ENCOUNTER — Emergency Department (HOSPITAL_BASED_OUTPATIENT_CLINIC_OR_DEPARTMENT_OTHER)
Admission: EM | Admit: 2017-03-20 | Discharge: 2017-03-20 | Disposition: A | Discharge status: Home / Self Care | Payer: BLUE CROSS/BLUE SHIELD | Attending: Emergency Medicine | Admitting: Emergency Medicine

## 2017-03-20 DIAGNOSIS — Z91040 Latex allergy status: Secondary | ICD-10-CM | POA: Diagnosis not present

## 2017-03-20 DIAGNOSIS — E119 Type 2 diabetes mellitus without complications: Secondary | ICD-10-CM | POA: Insufficient documentation

## 2017-03-20 DIAGNOSIS — R319 Hematuria, unspecified: Secondary | ICD-10-CM | POA: Diagnosis present

## 2017-03-20 DIAGNOSIS — N3091 Cystitis, unspecified with hematuria: Secondary | ICD-10-CM | POA: Diagnosis not present

## 2017-03-20 DIAGNOSIS — F17200 Nicotine dependence, unspecified, uncomplicated: Secondary | ICD-10-CM | POA: Diagnosis not present

## 2017-03-20 DIAGNOSIS — Z79899 Other long term (current) drug therapy: Secondary | ICD-10-CM | POA: Diagnosis not present

## 2017-03-20 DIAGNOSIS — Z853 Personal history of malignant neoplasm of breast: Secondary | ICD-10-CM | POA: Insufficient documentation

## 2017-03-20 DIAGNOSIS — J45909 Unspecified asthma, uncomplicated: Secondary | ICD-10-CM | POA: Insufficient documentation

## 2017-03-20 DIAGNOSIS — Z91010 Allergy to peanuts: Secondary | ICD-10-CM | POA: Diagnosis not present

## 2017-03-20 LAB — URINALYSIS, MICROSCOPIC (REFLEX)

## 2017-03-20 LAB — URINALYSIS, ROUTINE W REFLEX MICROSCOPIC
Glucose, UA: NEGATIVE mg/dL
KETONES UR: 15 mg/dL — AB
NITRITE: POSITIVE — AB
PH: 5 (ref 5.0–8.0)
Protein, ur: 100 mg/dL — AB
Specific Gravity, Urine: 1.022 (ref 1.005–1.030)

## 2017-03-20 MED ORDER — SULFAMETHOXAZOLE-TRIMETHOPRIM 800-160 MG PO TABS
1.0000 | ORAL_TABLET | Freq: Once | ORAL | Status: AC
  Administered 2017-03-20: 1 via ORAL
  Filled 2017-03-20: qty 1

## 2017-03-20 MED ORDER — PHENAZOPYRIDINE HCL 200 MG PO TABS: 200.0000 mg | ORAL_TABLET | Freq: Three times a day (TID) | ORAL | 0 refills | Status: AC | PRN

## 2017-03-20 MED ORDER — SULFAMETHOXAZOLE-TRIMETHOPRIM 800-160 MG PO TABS: 1.0000 | ORAL_TABLET | Freq: Two times a day (BID) | ORAL | 0 refills | Status: AC

## 2017-03-20 NOTE — ED Notes (Signed)
ED Provider at bedside. 

## 2017-03-20 NOTE — ED Triage Notes (Signed)
Pt awoke at midnight with hematuria, pain with urination and lower abd pain. Pt took azo prior to coming.

## 2017-03-20 NOTE — ED Notes (Signed)
Pt reports she had gastric bypass and since has had chronic diarrhea. Pt reports she had 2 episodes yesterday and believes the bacteria is what is causing her UTI. She has had several UTI's in the past.

## 2017-03-20 NOTE — ED Provider Notes (Signed)
[facility] Provider Note: Georgena Spurling, MD, FACEP  CSN: [id number] MRN: [id number] ARRIVAL: 03/20/17 at [place]: [place]  Hematuria   HISTORY OF PRESENT ILLNESS  03/20/17 4:42 AM Lindsey Keller is a 61 y.o. female who had the fairly sudden onset of urinary urgency, urinary frequency and hematuria about midnight today. She is having 4 out of 10 suprapubic pain and moderate to severe burning with urination. She has had chills but has not had a fever. She denies nausea, vomiting or back pain. She has chronic diarrhea status post bariatric surgery. She has taken Azo with some relief.   Past Medical History:  Diagnosis Date  . Allergy   . Anxiety   . Arthritis   . Asthma    INFREQUENT PROBLEM - NO INHALER CURRENTLY  . Barrett's esophagus   . Breast cancer ([place])    BREAST CANCER - L LUMPECTOMY - RADIATION, no lymph node involvement  . Complication of anesthesia    INSOMNIA / NIGHTMARES AFTER HYSTERECTOMY X 6 MONTHS, hyper   . DDD (degenerative disc disease), lumbar   . Depression   . Diabetes mellitus without complication ([place])    pt lost 50pds not a DM anymore  not on meds in 3 years   . GERD (gastroesophageal reflux disease)   . Glucose intolerance (pre-diabetes)   . IBS (irritable bowel syndrome)   . Left shoulder pain    chronic after gastric bypass surgery  . Obesity   . Other psoriasis    involving hands only  . Sleep - wake disorder   . Spinal stenosis   . Status post dilation of esophageal narrowing     Past Surgical History:  Procedure Laterality Date  . ABDOMINAL HYSTERECTOMY    . BREAST LUMPECTOMY  2005   LUMPECTOMY-CANCER  . BUNIONECTOMY Left    L FOOT  . CHOLECYSTECTOMY N/A 05/01/2015   Procedure: LAPAROSCOPIC CHOLECYSTECTOMY WITH INTRAOPERATIVE CHOLANGIOGRAM;  Surgeon: [name], MD;  Location: [facility];  Service: General;  Laterality: N/A;  . COLONOSCOPY    . DIAGNOSTIC LAPAROSCOPY     FOR ENDOMETRIOSIS  .  ESOPHAGOGASTRODUODENOSCOPY N/A 02/26/2017   Procedure: ESOPHAGOGASTRODUODENOSCOPY (EGD);  Surgeon: [name], MD;  Location: [facility];  Service: General;  Laterality: N/A;  . HIATAL HERNIA REPAIR N/A 02/26/2017   Procedure: LAPAROSCOPIC REPAIR OF HIATAL HERNIA;  Surgeon: [name], MD;  Location: [facility];  Service: General;  Laterality: N/A;  . LAPAROSCOPIC ROUX-EN-Y GASTRIC BYPASS WITH HIATAL HERNIA REPAIR N/A 05/01/2015   Procedure: LAPAROSCOPIC ROUX-EN-Y GASTRIC BYPASS WITH HIATAL HERNIA REPAIR-POSSIBLE CHOLECYSTECTOMY;  Surgeon: [name], MD;  Location: [facility];  Service: General;  Laterality: N/A;  . PARTIAL HYSTERECTOMY     no salpingo-oophorectomy  . PILONIDAL CYST EXCISION    . UPPER GASTROINTESTINAL ENDOSCOPY    . UPPER GI ENDOSCOPY  02/26/2017   Procedure: UPPER GI ENDOSCOPY;  Surgeon: [name], MD;  Location: [facility];  Service: General;;  . uti    . UTI  02/25/2017  . WISDOM TOOTH EXTRACTION      Family History  Problem Relation Age of Onset  . Adopted: Yes  . Colon cancer Neg Hx     Social History  Substance Use Topics  . Smoking status: Current Every Day Smoker    Packs/day: 0.25  . Smokeless tobacco: Never Used  . Alcohol use Yes     Comment: wine daily     Prior to Admission medications   Medication  Sig Start Date End Date Taking? Authorizing Provider  acetaminophen (TYLENOL) 160 MG/5ML solution Take 10.2-20.3 mLs (325-650 mg total) by mouth every 4 (four) hours as needed for moderate pain (severe pain). 05/03/15   [name], MD  calcium carbonate (OS-CAL) 1250 (500 Ca) MG chewable tablet Chew 1 tablet by mouth daily.    [provider]  Cyanocobalamin (VITAMIN B-12) 1000 MCG SUBL Place 1,000 mcg under the tongue daily.    [provider]  DEXILANT 60 MG capsule TAKE 1 CAPSULE(60 MG) BY MOUTH DAILY 02/21/17   Mauri Pole, MD  flintstones complete (FLINTSTONES) 60 MG chewable tablet Chew 1 tablet by mouth 2 (two) times daily.     [provider]  Hydrocortisone-Aloe Vera (CORTIZONE-10 PLUS EX) Apply 1 application topically 2 (two) times daily as needed (FOR EZCEMA/DRY SKIN  ON ANKLE).    [provider]  loperamide (IMODIUM) 2 MG capsule Take 2-4 mg by mouth 2 (two) times daily as needed for diarrhea or loose stools.     [provider]  oxyCODONE (OXY IR/ROXICODONE) 5 MG immediate release tablet Take 1-2 tablets (5-10 mg total) by mouth every 4 (four) hours as needed for moderate pain. 02/27/17   [name], MD  phenazopyridine (PYRIDIUM) 200 MG tablet Take 1 tablet (200 mg total) by mouth 3 (three) times daily as needed for pain. 03/20/17   Sharnetta Gielow, MD  sulfamethoxazole-trimethoprim (BACTRIM DS,SEPTRA DS) 800-160 MG tablet Take 1 tablet by mouth 2 (two) times daily. 03/20/17 03/27/17  Chason Mciver, Jenny Reichmann, MD    Allergies Macrobid [nitrofurantoin macrocrystal]; Peach [prunus persica]; Peanut-containing drug products; Adhesive [tape]; Keflex [cephalexin]; Nsaids; Pollen extract; Shellfish-derived products; Bextra [valdecoxib]; Diclofenac; Latex; and Nickel   REVIEW OF SYSTEMS  Negative except as noted here or in the History of Present Illness.   PHYSICAL EXAMINATION  Initial Vital Signs Blood pressure (!) 155/87, pulse 99, temperature 97.8 F (36.6 C), temperature source Oral, resp. rate 18, weight 70.8 kg (156 lb), SpO2 100 %.  Examination General: Well-developed, well-nourished female in no acute distress; appearance consistent with age of record HENT: normocephalic; atraumatic Eyes: pupils equal, round and reactive to light; extraocular muscles intact Neck: supple Heart: regular rate and rhythm Lungs: clear to auscultation bilaterally Abdomen: soft; nondistended; nontender; no masses or hepatosplenomegaly; bowel sounds present GU: No CVA tenderness Extremities: No deformity; full range of motion; pulses normal Neurologic: Awake, alert and oriented; motor function intact in all  extremities and symmetric; no facial droop Skin: Warm and dry Psychiatric: Normal mood and affect   RESULTS  Summary of this visit's results, reviewed by myself:   EKG Interpretation  Date/Time:    Ventricular Rate:    PR Interval:    QRS Duration:   QT Interval:    QTC Calculation:   R Axis:     Text Interpretation:        Laboratory Studies: Results for orders placed or performed during the hospital encounter of 03/20/17 (from the past 24 hour(s))  Urinalysis, Routine w reflex microscopic     Status: Abnormal   Collection Time: 03/20/17  3:11 AM  Result Value Ref Range   Color, Urine ORANGE (A) YELLOW   APPearance CLOUDY (A) CLEAR   Specific Gravity, Urine 1.022 1.005 - 1.030   pH 5.0 5.0 - 8.0   Glucose, UA NEGATIVE NEGATIVE mg/dL   Hgb urine dipstick MODERATE (A) NEGATIVE   Bilirubin Urine MODERATE (A) NEGATIVE   Ketones, ur 15 (A) NEGATIVE mg/dL   Protein,  ur 100 (A) NEGATIVE mg/dL   Nitrite POSITIVE (A) NEGATIVE   Leukocytes, UA LARGE (A) NEGATIVE  Urinalysis, Microscopic (reflex)     Status: Abnormal   Collection Time: 03/20/17  3:11 AM  Result Value Ref Range   RBC / HPF TOO NUMEROUS TO COUNT 0 - 5 RBC/hpf   WBC, UA TOO NUMEROUS TO COUNT 0 - 5 WBC/hpf   Bacteria, UA MANY (A) NONE SEEN   Squamous Epithelial / LPF 0-5 (A) NONE SEEN   Imaging Studies: No results found.  ED COURSE  Nursing notes and initial vitals signs, including pulse oximetry, reviewed.  Vitals:   03/20/17 0316 03/20/17 0317  BP: (!) 155/87   Pulse: 99   Resp: 18   Temp: 97.8 F (36.6 C)   TempSrc: Oral   SpO2: 100%   Weight:  70.8 kg (156 lb)    PROCEDURES    ED DIAGNOSES     ICD-10-CM   1. Hemorrhagic cystitis N30.91        Holden Maniscalco, Jenny Reichmann, MD 03/20/17 [phone]

## 2017-03-22 LAB — URINE CULTURE: Culture: 50000 — AB

## 2017-03-23 ENCOUNTER — Telehealth: Payer: Self-pay

## 2017-03-23 NOTE — Telephone Encounter (Signed)
Post ED Visit - Positive Culture Follow-up  Culture report reviewed by antimicrobial stewardship pharmacist:  []  Elenor Quinones, Pharm.D. []  Heide Guile, Pharm.D., BCPS AQ-ID [x]  Parks Neptune, Pharm.D., BCPS []  Alycia Rossetti, Pharm.D., BCPS []  [place], [place].D., BCPS, AAHIVP []  Legrand Como, Pharm.D., BCPS, AAHIVP []  Salome Arnt, PharmD, BCPS []  Dimitri Ped, PharmD, BCPS []  Vincenza Hews, PharmD, BCPS  Positive urine culture Treated with Sulfamethoxazole-Trimethoprim, organism sensitive to the same and no further patient follow-up is required at this time.  Genia Del 03/23/2017, 10:00 AM

## 2017-04-01 ENCOUNTER — Ambulatory Visit (HOSPITAL_BASED_OUTPATIENT_CLINIC_OR_DEPARTMENT_OTHER): Payer: BLUE CROSS/BLUE SHIELD

## 2017-04-03 ENCOUNTER — Ambulatory Visit (HOSPITAL_BASED_OUTPATIENT_CLINIC_OR_DEPARTMENT_OTHER)
Admission: RE | Admit: 2017-04-03 | Discharge: 2017-04-03 | Disposition: A | Discharge status: Home / Self Care | Payer: BLUE CROSS/BLUE SHIELD | Source: Ambulatory Visit | Attending: Oncology | Admitting: Oncology

## 2017-04-03 ENCOUNTER — Encounter (HOSPITAL_BASED_OUTPATIENT_CLINIC_OR_DEPARTMENT_OTHER): Payer: Self-pay

## 2017-04-03 DIAGNOSIS — Z1231 Encounter for screening mammogram for malignant neoplasm of breast: Secondary | ICD-10-CM | POA: Insufficient documentation

## 2017-04-17 ENCOUNTER — Other Ambulatory Visit: Payer: Self-pay | Admitting: Gastroenterology

## 2017-04-17 DIAGNOSIS — K2210 Ulcer of esophagus without bleeding: Secondary | ICD-10-CM

## 2018-03-03 ENCOUNTER — Other Ambulatory Visit: Payer: Self-pay | Admitting: Oncology

## 2018-03-03 DIAGNOSIS — Z1231 Encounter for screening mammogram for malignant neoplasm of breast: Secondary | ICD-10-CM

## 2018-04-09 ENCOUNTER — Other Ambulatory Visit: Payer: Self-pay | Admitting: Oncology

## 2018-04-09 ENCOUNTER — Ambulatory Visit (HOSPITAL_BASED_OUTPATIENT_CLINIC_OR_DEPARTMENT_OTHER)
Admission: RE | Admit: 2018-04-09 | Discharge: 2018-04-09 | Disposition: A | Discharge status: Home / Self Care | Payer: BLUE CROSS/BLUE SHIELD | Source: Ambulatory Visit | Attending: Oncology | Admitting: Oncology

## 2018-04-09 DIAGNOSIS — Z1231 Encounter for screening mammogram for malignant neoplasm of breast: Secondary | ICD-10-CM

## 2019-04-15 ENCOUNTER — Other Ambulatory Visit: Payer: Self-pay

## 2019-04-15 ENCOUNTER — Other Ambulatory Visit: Payer: Self-pay | Admitting: Oncology

## 2019-04-15 ENCOUNTER — Ambulatory Visit (HOSPITAL_BASED_OUTPATIENT_CLINIC_OR_DEPARTMENT_OTHER)
Admission: RE | Admit: 2019-04-15 | Discharge: 2019-04-15 | Disposition: A | Discharge status: Home / Self Care | Payer: BC Managed Care – PPO | Source: Ambulatory Visit | Attending: Oncology | Admitting: Oncology

## 2019-04-15 DIAGNOSIS — Z1231 Encounter for screening mammogram for malignant neoplasm of breast: Secondary | ICD-10-CM

## 2019-09-25 ENCOUNTER — Ambulatory Visit: Payer: BC Managed Care – PPO | Attending: Internal Medicine

## 2019-09-25 DIAGNOSIS — Z23 Encounter for immunization: Secondary | ICD-10-CM

## 2019-09-25 NOTE — Progress Notes (Signed)
   Covid-19 Vaccination Clinic  Name:  Lindsey Keller    MRN: [id number] DOB: 01-15-1956  09/25/2019  [name] was observed post Covid-19 immunization for 30 minutes based on pre-vaccination screening without incidence. She was provided with Vaccine Information Sheet and instruction to access the V-Safe system.   [name] was instructed to call 911 with any severe reactions post vaccine: Marland Kitchen Difficulty breathing  . Swelling of your face and throat  . A fast heartbeat  . A bad rash all over your body  . Dizziness and weakness    Immunizations Administered    Name Date Dose VIS Date Route   Pfizer COVID-19 Vaccine 09/25/2019  2:15 PM 0.3 mL 07/09/2019 Intramuscular   Manufacturer: [place]   Lot: [id number]   [place]: [id number]

## 2019-10-16 ENCOUNTER — Ambulatory Visit: Payer: BC Managed Care – PPO | Attending: Internal Medicine

## 2019-10-16 DIAGNOSIS — Z23 Encounter for immunization: Secondary | ICD-10-CM

## 2019-10-16 NOTE — Progress Notes (Signed)
   Covid-19 Vaccination Clinic  Name:  Lindsey Keller    MRN: [id number] DOB: Oct 10, 1955  10/16/2019  Lindsey Keller was observed post Covid-19 immunization for 30 minutes based on pre-vaccination screening without incident. She was provided with Vaccine Information Sheet and instruction to access the V-Safe system.   Lindsey Keller was instructed to call 911 with any severe reactions post vaccine: Marland Kitchen Difficulty breathing  . Swelling of face and throat  . A fast heartbeat  . A bad rash all over body  . Dizziness and weakness   Immunizations Administered    Name Date Dose VIS Date Route   Pfizer COVID-19 Vaccine 10/16/2019 11:40 AM 0.3 mL 07/09/2019 Intramuscular   Manufacturer: [place]   Lot: [id number]   [place]: [id number]

## 2020-04-18 ENCOUNTER — Ambulatory Visit (HOSPITAL_BASED_OUTPATIENT_CLINIC_OR_DEPARTMENT_OTHER): Payer: BC Managed Care – PPO

## 2020-04-25 ENCOUNTER — Encounter (HOSPITAL_BASED_OUTPATIENT_CLINIC_OR_DEPARTMENT_OTHER): Payer: Self-pay

## 2020-04-25 ENCOUNTER — Ambulatory Visit (HOSPITAL_BASED_OUTPATIENT_CLINIC_OR_DEPARTMENT_OTHER)
Admission: RE | Admit: 2020-04-25 | Discharge: 2020-04-25 | Disposition: A | Discharge status: Home / Self Care | Payer: BC Managed Care – PPO | Source: Ambulatory Visit | Attending: Oncology | Admitting: Oncology

## 2020-04-25 ENCOUNTER — Other Ambulatory Visit (HOSPITAL_BASED_OUTPATIENT_CLINIC_OR_DEPARTMENT_OTHER): Payer: Self-pay | Admitting: Family Medicine

## 2020-04-25 ENCOUNTER — Other Ambulatory Visit: Payer: Self-pay

## 2020-04-25 DIAGNOSIS — Z1231 Encounter for screening mammogram for malignant neoplasm of breast: Secondary | ICD-10-CM | POA: Diagnosis present

## 2020-04-26 ENCOUNTER — Other Ambulatory Visit: Payer: Self-pay | Admitting: Family Medicine

## 2020-04-26 ENCOUNTER — Other Ambulatory Visit (HOSPITAL_BASED_OUTPATIENT_CLINIC_OR_DEPARTMENT_OTHER): Payer: Self-pay | Admitting: Family Medicine

## 2020-04-26 DIAGNOSIS — Z1231 Encounter for screening mammogram for malignant neoplasm of breast: Secondary | ICD-10-CM

## 2020-05-26 ENCOUNTER — Ambulatory Visit: Payer: BC Managed Care – PPO | Attending: Internal Medicine

## 2020-05-26 ENCOUNTER — Other Ambulatory Visit (HOSPITAL_BASED_OUTPATIENT_CLINIC_OR_DEPARTMENT_OTHER): Payer: Self-pay | Admitting: Internal Medicine

## 2020-05-26 DIAGNOSIS — Z23 Encounter for immunization: Secondary | ICD-10-CM

## 2020-05-29 NOTE — Progress Notes (Signed)
   Covid-19 Vaccination Clinic  Name:  Lindsey Keller    MRN: [id number] DOB: 1956/01/23  05/29/2020  [name] was observed post Covid-19 immunization for 15 minutes without incident. She was provided with Vaccine Information Sheet and instruction to access the V-Safe system.   [name] was instructed to call 911 with any severe reactions post vaccine: Marland Kitchen Difficulty breathing  . Swelling of face and throat  . A fast heartbeat  . A bad rash all over body  . Dizziness and weakness

## 2020-06-02 MED FILL — PFIZER-BIONTECH COVID-19 VA: 30 | 1 days supply | Qty: 0 | Fill #0

## 2021-04-26 ENCOUNTER — Ambulatory Visit (HOSPITAL_BASED_OUTPATIENT_CLINIC_OR_DEPARTMENT_OTHER): Payer: BC Managed Care – PPO

## 2021-05-02 ENCOUNTER — Other Ambulatory Visit (HOSPITAL_BASED_OUTPATIENT_CLINIC_OR_DEPARTMENT_OTHER): Payer: Self-pay | Admitting: Oncology

## 2021-05-02 DIAGNOSIS — Z1231 Encounter for screening mammogram for malignant neoplasm of breast: Secondary | ICD-10-CM

## 2021-05-29 ENCOUNTER — Encounter (HOSPITAL_BASED_OUTPATIENT_CLINIC_OR_DEPARTMENT_OTHER): Payer: Self-pay

## 2021-05-29 ENCOUNTER — Other Ambulatory Visit: Payer: Self-pay

## 2021-05-29 ENCOUNTER — Ambulatory Visit (HOSPITAL_BASED_OUTPATIENT_CLINIC_OR_DEPARTMENT_OTHER)
Admission: RE | Admit: 2021-05-29 | Discharge: 2021-05-29 | Disposition: A | Discharge status: Home / Self Care | Payer: BC Managed Care – PPO | Source: Ambulatory Visit | Attending: Oncology | Admitting: Oncology

## 2021-05-29 ENCOUNTER — Other Ambulatory Visit (HOSPITAL_BASED_OUTPATIENT_CLINIC_OR_DEPARTMENT_OTHER): Payer: Self-pay | Admitting: Oncology

## 2021-05-29 DIAGNOSIS — Z1231 Encounter for screening mammogram for malignant neoplasm of breast: Secondary | ICD-10-CM | POA: Insufficient documentation

## 2022-04-25 ENCOUNTER — Telehealth (HOSPITAL_BASED_OUTPATIENT_CLINIC_OR_DEPARTMENT_OTHER): Payer: Self-pay

## 2022-06-05 ENCOUNTER — Other Ambulatory Visit (HOSPITAL_BASED_OUTPATIENT_CLINIC_OR_DEPARTMENT_OTHER): Payer: Self-pay | Admitting: Physician Assistant

## 2022-06-05 DIAGNOSIS — Z1231 Encounter for screening mammogram for malignant neoplasm of breast: Secondary | ICD-10-CM

## 2022-06-11 ENCOUNTER — Encounter (HOSPITAL_BASED_OUTPATIENT_CLINIC_OR_DEPARTMENT_OTHER): Payer: Self-pay

## 2022-06-11 ENCOUNTER — Ambulatory Visit (HOSPITAL_BASED_OUTPATIENT_CLINIC_OR_DEPARTMENT_OTHER)
Admission: RE | Admit: 2022-06-11 | Discharge: 2022-06-11 | Disposition: A | Discharge status: Home / Self Care | Payer: Medicare Other | Source: Ambulatory Visit | Attending: Physician Assistant | Admitting: Physician Assistant

## 2022-06-11 DIAGNOSIS — Z1231 Encounter for screening mammogram for malignant neoplasm of breast: Secondary | ICD-10-CM | POA: Insufficient documentation

## 2023-05-14 ENCOUNTER — Other Ambulatory Visit (HOSPITAL_BASED_OUTPATIENT_CLINIC_OR_DEPARTMENT_OTHER): Payer: Self-pay | Admitting: Physician Assistant

## 2023-05-14 DIAGNOSIS — Z139 Encounter for screening, unspecified: Secondary | ICD-10-CM

## 2023-06-17 ENCOUNTER — Ambulatory Visit (HOSPITAL_BASED_OUTPATIENT_CLINIC_OR_DEPARTMENT_OTHER)
Admission: RE | Admit: 2023-06-17 | Discharge: 2023-06-17 | Disposition: A | Discharge status: Home / Self Care | Payer: Medicare Other | Source: Ambulatory Visit | Attending: Physician Assistant | Admitting: Physician Assistant

## 2023-06-17 ENCOUNTER — Encounter (HOSPITAL_BASED_OUTPATIENT_CLINIC_OR_DEPARTMENT_OTHER): Payer: Self-pay

## 2023-06-17 DIAGNOSIS — Z1231 Encounter for screening mammogram for malignant neoplasm of breast: Secondary | ICD-10-CM | POA: Insufficient documentation

## 2023-06-17 DIAGNOSIS — Z139 Encounter for screening, unspecified: Secondary | ICD-10-CM

## 2024-05-24 ENCOUNTER — Other Ambulatory Visit (HOSPITAL_BASED_OUTPATIENT_CLINIC_OR_DEPARTMENT_OTHER): Payer: Self-pay | Admitting: Physician Assistant

## 2024-05-24 DIAGNOSIS — Z1231 Encounter for screening mammogram for malignant neoplasm of breast: Secondary | ICD-10-CM

## 2024-06-21 ENCOUNTER — Encounter (HOSPITAL_BASED_OUTPATIENT_CLINIC_OR_DEPARTMENT_OTHER): Payer: Self-pay

## 2024-06-21 ENCOUNTER — Ambulatory Visit (HOSPITAL_BASED_OUTPATIENT_CLINIC_OR_DEPARTMENT_OTHER)
Admission: RE | Admit: 2024-06-21 | Discharge: 2024-06-21 | Disposition: A | Discharge status: Home / Self Care | Payer: PRIVATE HEALTH INSURANCE | Source: Ambulatory Visit | Attending: Physician Assistant | Admitting: Physician Assistant

## 2024-06-21 DIAGNOSIS — Z1231 Encounter for screening mammogram for malignant neoplasm of breast: Secondary | ICD-10-CM | POA: Insufficient documentation
